# Patient Record
Sex: Female | Born: 1975 | Race: White | Hispanic: Yes | Marital: Married | Smoking: Former smoker
Health system: Southern US, Community
[De-identification: ages and names within clinical notes are randomized; demographics above are authoritative.]

## PROBLEM LIST (undated history)

## (undated) DIAGNOSIS — N946 Dysmenorrhea, unspecified: Secondary | ICD-10-CM

## (undated) DIAGNOSIS — N921 Excessive and frequent menstruation with irregular cycle: Secondary | ICD-10-CM

## (undated) DIAGNOSIS — D649 Anemia, unspecified: Secondary | ICD-10-CM

## (undated) DIAGNOSIS — Z8742 Personal history of other diseases of the female genital tract: Secondary | ICD-10-CM

## (undated) DIAGNOSIS — E559 Vitamin D deficiency, unspecified: Secondary | ICD-10-CM

## (undated) DIAGNOSIS — D269 Other benign neoplasm of uterus, unspecified: Secondary | ICD-10-CM

## (undated) HISTORY — PX: BREAST SURGERY: SHX581

## (undated) HISTORY — DX: Vitamin D deficiency, unspecified: E55.9

## (undated) HISTORY — PX: COSMETIC SURGERY: SHX468

## (undated) HISTORY — PX: AUGMENTATION MAMMAPLASTY: SUR837

## (undated) HISTORY — DX: Anemia, unspecified: D64.9

---

## 2006-10-20 HISTORY — PX: AUGMENTATION MAMMAPLASTY: SUR837

## 2013-08-15 ENCOUNTER — Ambulatory Visit (INDEPENDENT_AMBULATORY_CARE_PROVIDER_SITE_OTHER): Payer: BC Managed Care – PPO | Admitting: Gynecology

## 2013-08-15 ENCOUNTER — Other Ambulatory Visit (HOSPITAL_COMMUNITY)
Admission: RE | Admit: 2013-08-15 | Discharge: 2013-08-15 | Disposition: A | Discharge status: Home / Self Care | Payer: Medicare Other | Source: Ambulatory Visit | Attending: Gynecology | Admitting: Gynecology

## 2013-08-15 ENCOUNTER — Encounter: Payer: Self-pay | Admitting: Gynecology

## 2013-08-15 VITALS — BP 122/86 | Ht 65.5 in | Wt 144.0 lb

## 2013-08-15 DIAGNOSIS — D649 Anemia, unspecified: Secondary | ICD-10-CM

## 2013-08-15 DIAGNOSIS — Z01419 Encounter for gynecological examination (general) (routine) without abnormal findings: Secondary | ICD-10-CM

## 2013-08-15 DIAGNOSIS — N920 Excessive and frequent menstruation with regular cycle: Secondary | ICD-10-CM

## 2013-08-15 DIAGNOSIS — Z1151 Encounter for screening for human papillomavirus (HPV): Secondary | ICD-10-CM | POA: Insufficient documentation

## 2013-08-15 DIAGNOSIS — Z23 Encounter for immunization: Secondary | ICD-10-CM

## 2013-08-15 NOTE — Progress Notes (Signed)
Tina Garza Marval Regal 1976-01-14 [id number]   History:    38 y.o.  for annual gyn exam who is a new patient to the practice. Patient stated she has not had a gynecological exam since 2013 [facility]. She recently saw primary care physician here in [place] and was diagnosed with anemia and vitamin B12 deficiency. She's currently on a vitamin FE oral spray which she was informed to increase her iron absorption. She states her last hemoglobin was 10 g. She suffers from menorrhagia. She was recently diagnosed with vitamin D deficiency as well for which she is taking 50,000 units of vitamin D Q. Weekly for a 12 week course planned. The patient states that she's always had normal Pap smears the last one in 2012. He freely does her self breast examination. She stated that in addition to the CBC she had a normal cholesterol and blood sugar screening recently. Patient did state that several years ago in [place] she had a sonohysterogram but never got the report back.  Past medical history,surgical history, family history and social history were all reviewed and documented in the EPIC chart.  Gynecologic History Patient's last menstrual period was 08/05/2013. Contraception: condoms Last Pap: 2012. Results were: normal Last mammogram: not indicated. Results were: not indicated  Obstetric History OB History  Gravida Para Term Preterm AB SAB TAB Ectopic Multiple Living  1 1        1     # Outcome Date GA Lbr Len/2nd Weight Sex Delivery Anes PTL Lv  1 PAR                ROS: A ROS was performed and pertinent positives and negatives are included in the history.  GENERAL: No fevers or chills. HEENT: No change in vision, no earache, sore throat or sinus congestion. NECK: No pain or stiffness. CARDIOVASCULAR: No chest pain or pressure. No palpitations. PULMONARY: No shortness of breath, cough or wheeze. GASTROINTESTINAL: No abdominal pain, nausea, vomiting or diarrhea,  melena or bright red blood per rectum. GENITOURINARY: No urinary frequency, urgency, hesitancy or dysuria. MUSCULOSKELETAL: No joint or muscle pain, no back pain, no recent trauma. DERMATOLOGIC: No rash, no itching, no lesions. ENDOCRINE: No polyuria, polydipsia, no heat or cold intolerance. No recent change in weight. HEMATOLOGICAL: anemia as a result of her heavy periods lasting up to 12 days NEUROLOGIC: No headache, seizures, numbness, tingling or weakness. PSYCHIATRIC: No depression, no loss of interest in normal activity or change in sleep pattern.     Exam: chaperone present  BP 122/86  Ht 5' 5.5" (1.664 m)  Wt 144 lb (65.318 kg)  BMI 23.59 kg/m2  LMP 08/05/2013  Body mass index is 23.59 kg/(m^2).  General appearance : Well developed well nourished female. No acute distress HEENT: Neck supple, trachea midline, no carotid bruits, no thyroidmegaly Lungs: Clear to auscultation, no rhonchi or wheezes, or rib retractions  Heart: Regular rate and rhythm, no murmurs or gallops Breast:Examined in sitting and supine position were symmetrical in appearance, no palpable masses or tenderness,  no skin retraction, no nipple inversion, no nipple discharge, no skin discoloration, no axillary or supraclavicular lymphadenopathy Abdomen: no palpable masses or tenderness, no rebound or guarding Extremities: no edema or skin discoloration or tenderness  Pelvic:  Bartholin, Urethra, Skene Glands: Within normal limits             Vagina: No gross lesions or discharge  Cervix: No gross lesions or discharge  Uterus  anteverted, normal size,  shape and consistency, non-tender and mobile  Adnexa  Without masses or tenderness  Anus and perineum  normal   Rectovaginal  normal sphincter tone without palpated masses or tenderness             Hemoccult not indicated     Assessment/Plan:  37 y.o. 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 Patient stated that in the  past she has tried the NuvaRing but didn't like the effects and also many years ago she had been on the oral contraceptive pill as well as a contraceptive patch. We discussed obtain sonohysterogram report from a couple years ago her from [facility] and also we will try to obtain the results of her recent labs at her primary care physician's office. We will schedule her in the next couple weeks to have a sonohysterogram here in office. I have given her literature information on the Mirena IUD which will serve 2 purposes for her 1 for cycle control and improve her anemia as well as for contraception. Pap smear with HPV screen was done today. We will also test her for von Willebrand's disease. We discussed the importance of monthly self breast examinations. Literature information was provided.  Note: This dictation was prepared with  Dragon/digital dictation along withSmart phrase technology. Any transcriptional errors that result from this process are unintentional.   Ok Edwards MD, 5:05 PM 08/15/2013

## 2013-08-15 NOTE — Patient Instructions (Signed)
Informacin sobre el dispositivo intrauterino  (Intrauterine Device Information) El dispositivo intrauterino (DIU) se inserta en el tero e impide el embarazo. Hay dos tipos de DIU:  DIU de cobre. Este tipo de DIU est recubierto con un alambre de cobre y se inserta dentro del tero. El cobre hace que el tero y las trompas de Falopio produzcan un liquido que [employer] espermatozoides. El DIU de cobre puede Geneticist, molecular durante 10 aos. DIU hormonal. Este tipo de DIU contiene la hormona progestina (progesterona sinttica). La hormona espesa el moco cervical y evita que los espermatozoides ingresen al tero y tambin afina la membrana que cubre el tero para evitar la implantacin del vulo fertilizado. La hormona debilita o destruye los espermatozoides que ingresan al tero. El DIU hormonal puede Geneticist, molecular durante 5 aos. El mdico se asegurar de que usted es una buena candidata para usar el DIU cono anticonceptivo. Hable con su mdico acerca de los posibles efectos secundarios.  VENTAJAS Es muy eficaz, reversible, de accin prolongada y de bajo mantenimiento. No hay efectos secundarios relacionados con el estrgeno. El DIU puede ser utilizado durante la Market researcher. No est asociado con el aumento de [place]. Funciona inmediatamente despus de la insercin. El DIU de cobre no interfiere con las hormonas femeninas. El DIU con progesterona puede hacer que los perodos menstruales no sean tan abundantes. El DIU de progesterona puede usarse durante 5 aos. El DIU de cobre puede usarse durante 10 aos. DESVENTAJAS El DIU de progesterona puede estar asociado con patrones de sangrado irregular. El DIU de cobre puede hacer que el flujo menstrual ms abundante y doloroso. Puede experimentar clicos y sangrado vaginal despus de la insercin. Document Released: 03/26/2010 Document Revised: 12/29/2011 [facility] Patient Information 2014 [place], [place]. Vacuna antigripal (vacuna  antigripal inactivada) 2013 2014, Lo que debe saber  (Influenza Vaccine [Flu Vaccine, Inactivated] 2013 2014, What You Need to Know) PORQU VACUNARSE?   La influenza ("gripe") es una enfermedad contagiosa que se propaga por los [place] en invierno, por lo general entre octubre y [place].  La causa de la gripe es el virus de la influenza, y se puede contagiar por la tos, al estornudar y por el contacto cercano.  Cualquier persona puede Writer gripe, Biomedical engineer el riesgo es mayor entre los nios. Los sntomas aparecen rpidamente y pueden durar [address]. Pueden ser:  Grant Ruts o escalofros.  Dolor de Advertising copywriter.  Dolores musculares.  La fatiga.  Tos.  Dolor de [place].  Secrecin o congestin nasal. La gripe puede hacer que algunas personas se enfermen ms que otros. Entre [employer] se incluyen a los nios pequeos, las [employer] International de 65 aos, las mujeres embarazadas y las personas con Runner, broadcasting/film/video, como enfermedades cardacas, pulmonares o renales, o que tienen un sistema inmunolgico debilitado. La vacuna contra la gripe es especialmente importante para estas personas y para todos los que estn en estrecho contacto con ellos.  La gripe tambin puede causar neumona y Theme park manager las afecciones existentes. En los nios, puede provocar diarrea y convulsiones.  Cada ao miles de [employer] [place] debido a la gripe y muchos ms deben ser hospitalizados.  La vacuna contra la gripe es la mejor proteccin que existe contra la gripe y sus complicaciones. La vacuna contra la gripe tambin ayuda a prevenir la propagacin de la gripe de Neomia Dear persona a Educational psychologist.  VACUNA INACTIVADA CONTRA LA GRIPE  Hay dos tipos de vacunas contra la gripe:   Usted recibir  la vacuna de la gripe inactivada, que no contiene virus vivo. Se administra en forma de inyeccin con Marella Bile y se llama la "vacuna antigripal".  Otro tipo de vacuna con virus vivos, atenuados (debilitados), se  aplica en forma de aerosol en las fosas nasales. Esta vacuna se describe en el apartado Informacin sobre las vacunas. Se recomienda aplicarse la vacuna contra la gripe todos los [place]. Los nios [employer] 6 meses y los 8 aos de edad deben recibir 2 dosis Dispensing optician que se vacunen.  Los virus de la gripe [place] constantemente. Cada ao, la vacuna contra la gripe se actualiza para proteger contra los virus que tienen ms probabilidades de causar la enfermedad ese ao. Aunque la vacuna no puede prevenir todos los casos de gripe, es nuestra mejor defensa contra la enfermedad. Vacuna contra la gripe inactivada protege contra 3 o 4 virus diferentes.  Se tarda aproximadamente 2 semanas para desarrollar la proteccin despus de la vacunacin y la proteccin dura entre algunos meses y un ao.  Muchas veces se confunden con la gripe algunas enfermedades que no son causadas por el virus de la gripe. La vacuna contra la gripe no previene estas enfermedades. Slo se puede prevenir la gripe.  Para las personas de ms de 65 aos, se dispone de una vacuna contra la gripe de "dosis elevada". La persona que aplica la vacuna puede darle ms informacin al respecto.  Algunas de las vacunas contra la gripe inactivada contienen una cantidad muy pequea de un conservante a base de mercurio llamado timerosal. Algunos estudios han demostrado que el timerosal en las vacunas no es perjudicial, pero se dispone de vacunas contra la gripe que no contienen el conservante.  ALGUNAS PERSONAS NO DEBEN RECIBIR ESTA VACUNA Informe a la persona que le aplica la vacuna:   Si sufre alguna alergia grave (que pone en peligro la vida). Si alguna vez tuvo una reaccin alrgica potencialmente mortal despus de Neomia Dear dosis de la vacuna contra la gripe, o tuvo una alergia grave a cualquiera de los componentes de [place], es posible que se le recomiende no recibir una dosis. La [employer]son de las vacunas contra la gripe, aunque no todas, contienen  una pequea cantidad de [place].  Si alguna vez ha sufrido el sndrome de Pension scheme manager (una enfermedad paralizante grave tambin llamada GBS). Algunas personas con antecedentes de GBS no deben recibir esta vacuna. Debe comentarlo con su mdico.  Si no se siente bien. Podran sugerirle que espere hasta sentirse mejor. Pero debe volver. RIESGOS DE UNA REACCIN A LA VACUNA Con la vacuna, como cualquier medicamento, existe la posibilidad de sufrir efectos secundarios. Suelen ser leves y desaparecen por s solos.  Los efectos secundarios graves son [place], pero son Lynnae Sandhoff raros. Vacuna de la gripe inactivada no contiene el virus vivo de la gripe, la gripe por lo tanto enfermarse por recibir la vacuna no es posible.  Episodios de desmayo leves y sntomas relacionados (tales como sacudidas) pueden presentarse despus de cualquier procedimiento mdico, incluyendo la vacunacin. Si permanece sentado o recostado durante 15 minutos despus de la vacunacin puede ayudar a [employer] Corporation y las lesiones causadas por las cadas. Informe al mdico si se siente mareado o aturdido, tiene [employer] Industries visin o zumbidos en los odos.  Problemas leves luego de recibir la vacuna de la gripe inactivada:   Barista, enrojecimiento o Paramedic en el que le aplicaron la vacuna.  Ronquera; dolor, inflamacin o picazn en  los ojos o tos.  Grant Ruts.  Dolores.  Dolor de [place].  Picazn.  Fatiga. Si estos problemas ocurren, en general comienzan poco despus de vacunarse y duran 1  2 das.  Problemas moderados luego de recibir la vacuna de la gripe inactivada:   Los nios que reciben la vacuna contra la gripe inactivada y Research scientist (medical) antineumoccica (PCV13) al mismo tiempo, pueden tener un mayor riesgo de sufrir convulsiones causadas por fiebre. Consulte a su mdico para obtener ms informacin. Informe a su mdico si un nio que est recibiendo la vacuna contra la gripe ha tenido una  convulsin. Problemas graves luego de recibir la vacuna inactivada contra la gripe:   Neomia Dear reaccin alrgica grave puede ocurrir despus de la administracin de cualquier vacuna (se estima en menos de 1 en un milln de dosis).  Hay una pequea posibilidad de que la vacuna de la gripe inactivada est asociada con el sndrome de Guillain-Barr (GBS), no ms de 1 o 2 casos por milln de personas vacunadas. Es Chief Operating Officer que el riesgo de sufrir complicaciones graves por la gripe, que puede prevenirse con la vacunacin. Se controla permanentemente la seguridad de las vacunas. Para obtener ms informacin, consulte [email or web address]/  QU PASA SI HAY UNA REACCIN GRAVE?  Qu signos debo buscar?   Observe todo lo que le preocupe, como signos de una reaccin alrgica grave, fiebre muy alta o cambios en el comportamiento. Los signos de Runner, broadcasting/film/video grave pueden incluir urticaria, hinchazn de la cara y la garganta, dificultad para respirar, ritmo cardaco acelerado, mareos y debilidad. Pueden comenzar entre unos pocos minutos y algunas horas despus de la vacunacin.  Qu debo hacer?   Si usted piensa que se trata de una reaccin alrgica grave o de otra emergencia que no puede esperar, llame al 911 o lleve a la persona al hospital ms cercano. De lo contrario, llame a su mdico.  Despus, la reaccin debe informarse a la "Vaccine Adverse Event Reporting System" (Sistema de informacin sobre efectos adversos de las vacunas -VAERS). Su mdico puede presentar este informe, o puede hacerlo usted mismo a travs del sitio web de VAERS, en www.vaers.LAgents.no, o llamando al (916) 030-7362. VAERS es slo para informar reacciones. No brindan consejo mdico.  PROGRAMA NACIONAL DE COMPENSACIN DE DAOS POR VACUNAS  El National Vaccine Injury Compensation Program (VICP) es un programa federal que fue creado para compensar a las personas que puedan haber sufrido daos al recibir ciertas vacunas.   Aquellas personas que consideren que han sufrido un dao como consecuencia de una vacuna y quieren saber ms acerca del programa y como presentar Roslynn Amble, [place] llamar al [phone] o visitar su sitio web en SpiritualWord.at.  CMO PUEDO OBTENER MS INFORMACIN?   Consulte a su mdico.  Comunquese con el servicio de salud de su localidad o [address].  Comunquese con los Centros para el control y la prevencin de Child psychotherapist for Disease Control and Prevention , CDC).  Llame al 930-664-1498 (1-800-CDC-INFO) o  Visite la pgina web de los CDC en BiotechRoom.com.cy. CDC Inactivated Influenza Vaccine Interim VIS (05/14/12)  Document Released: 01/02/2009 Document Revised: 06/30/2012 ExitCare Patient Information 2014 [place], [place].

## 2013-08-17 ENCOUNTER — Other Ambulatory Visit: Payer: Self-pay | Admitting: Gynecology

## 2013-08-17 DIAGNOSIS — N920 Excessive and frequent menstruation with regular cycle: Secondary | ICD-10-CM

## 2013-08-19 LAB — VON WILLEBRAND PANEL: Ristocetin Co-factor, Plasma: 86 % (ref 42–200)

## 2013-09-02 ENCOUNTER — Ambulatory Visit (INDEPENDENT_AMBULATORY_CARE_PROVIDER_SITE_OTHER): Payer: BC Managed Care – PPO | Admitting: Gynecology

## 2013-09-02 ENCOUNTER — Other Ambulatory Visit: Payer: Self-pay | Admitting: Gynecology

## 2013-09-02 ENCOUNTER — Telehealth: Payer: Self-pay | Admitting: Gynecology

## 2013-09-02 ENCOUNTER — Ambulatory Visit (INDEPENDENT_AMBULATORY_CARE_PROVIDER_SITE_OTHER): Payer: BC Managed Care – PPO

## 2013-09-02 DIAGNOSIS — N83209 Unspecified ovarian cyst, unspecified side: Secondary | ICD-10-CM

## 2013-09-02 DIAGNOSIS — D649 Anemia, unspecified: Secondary | ICD-10-CM

## 2013-09-02 DIAGNOSIS — N83202 Unspecified ovarian cyst, left side: Secondary | ICD-10-CM | POA: Insufficient documentation

## 2013-09-02 DIAGNOSIS — N920 Excessive and frequent menstruation with regular cycle: Secondary | ICD-10-CM

## 2013-09-02 DIAGNOSIS — Z3049 Encounter for surveillance of other contraceptives: Secondary | ICD-10-CM

## 2013-09-02 MED ORDER — MEGESTROL ACETATE 40 MG PO TABS: ORAL_TABLET | ORAL | Status: DC

## 2013-09-02 MED ORDER — POLYSACCH FE COMPLEX-VIT C-FA 100-60-1 MG/5ML PO SOLR: 100.0000 mg | Freq: Every day | ORAL | Status: DC

## 2013-09-02 MED ORDER — LEVONORGESTREL 20 MCG/24HR IU IUD: INTRAUTERINE_SYSTEM | Freq: Once | INTRAUTERINE | Status: DC

## 2013-09-02 NOTE — Telephone Encounter (Signed)
09/02/13-PT ADVISED MIRENA COVERED AT 100%,NO COPAY. APPT JF FOR 09/05/13/[facility]

## 2013-09-02 NOTE — Progress Notes (Signed)
Patient presented to the office today for her ongoing evaluation of her menorrhagia and anemia. She was seen as a new patient for the first time last month. Her PCP recently diagnosed her with vitamin B12 deficiency and anemia.She's currently on a vitamin FE oral spray which she was informed to increase her iron absorption. She states her last hemoglobin was 10 g. She was recently diagnosed with vitamin D deficiency as well for which she is taking 50,000 units of vitamin D Q. Weekly for a 12 week course planned. The patient states that she's always had normal Pap smears the last one in 2012. Patient stated that in the past she has tried the NuvaRing but didn't like the effects and also many years ago she had been on the oral contraceptive pill as well as a contraceptive patch. We discussed obtain sonohysterogram report from a couple years ago her from [facility] and also we will try to obtain the results of her recent labs at her primary care physician's office.I have given her literature information on the Mirena IUD which will serve 2 purposes for her 1 for cycle control and improve her anemia as well as for contraception. Pap smear with HPV screen on recent visit was normal.  Patient stated that her last menstrual period started approximately 8 days ago. Ultrasound today as follows:  Uterus measures 9.0 x 6.2 x 4.9 cm with endometrial stripe of 6.7 mm. 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 Bladder was normal. No fluid in the cul-de-sac. A sterile catheter was introduced into the uterine cavity and normal saline was instilled. There were no intracavitary defects noted.  Assessment/plan: Patient with menorrhagia and anemia. Von Willebrand panel done here in the office last visit was normal. Pap smear was normal. Patient will be seeing her PCP on Monday and will have her CBC check as well as vitamin B12 level. She will return back on Monday to  place a Mirena IUD for contraception as well as for cycle control. To stop her bleeding she is going to be prescribed today Megace 40 mg 1 by mouth twice a day for 7 days. She is going to stop her current iron regimen and she is going to be started on NovaFerrum 1 tablespoon (5 mL) daily because she has trouble with oral iron tablets.

## 2013-09-05 ENCOUNTER — Encounter: Payer: Self-pay | Admitting: Gynecology

## 2013-09-05 ENCOUNTER — Ambulatory Visit (INDEPENDENT_AMBULATORY_CARE_PROVIDER_SITE_OTHER): Payer: BC Managed Care – PPO | Admitting: Gynecology

## 2013-09-05 ENCOUNTER — Ambulatory Visit: Payer: BC Managed Care – PPO | Admitting: Gynecology

## 2013-09-05 ENCOUNTER — Encounter: Payer: BC Managed Care – PPO | Admitting: Gynecology

## 2013-09-05 VITALS — BP 126/78

## 2013-09-05 DIAGNOSIS — Z975 Presence of (intrauterine) contraceptive device: Secondary | ICD-10-CM | POA: Insufficient documentation

## 2013-09-05 DIAGNOSIS — Z30430 Encounter for insertion of intrauterine contraceptive device: Secondary | ICD-10-CM

## 2013-09-05 NOTE — Progress Notes (Signed)
The patient was sedate have the Mirena IUD placed. Please see previous notes from last encounter 09/02/2013. Patient suffered from dysmenorrhea and menorrhagia and iron deficiency anemia. She brought her CBC from her PCP office from 07/15/2013 which had indicated her hemoglobin was 10.2 and hematocrit 32.7 platelet count 509,000. Patient is on iron supplementation recently. The IUD was not only for contraception but are cut down her bleeding to correct her anemia.                                                                    IUD procedure note       Patient presented to the office today for placement of Mirena IUD. The patient had previously been provided with literature information on this method of contraception. The risks benefits and pros and cons were discussed and all her questions were answered. She is fully aware that this form of contraception is 99% effective and is good for 5 years.  Pelvic exam: Bartholin urethra Skene glands: Within normal limits Vagina: No lesions or discharge Cervix: No lesions or discharge Uterus: anteverted position Adnexa: No masses or tenderness Rectal exam: Not done  The cervix was cleansed with Betadine solution. A single-tooth tenaculum was placed on the anterior cervical lip. The uterus sounded to 7-1/2 centimeter. The IUD was shown to the patient and inserted in a sterile fashion. The IUD string was trimmed. The single-tooth tenaculum was removed. Patient was instructed to return back to the office in one month for follow up.

## 2013-09-05 NOTE — Patient Instructions (Signed)
Informacin sobre el dispositivo intrauterino (Intrauterine Device Information) Un dispositivo intrauterino (DIU) se inserta en el tero e impide el embarazo. Hay dos tipos de DIU:   DIU de cobre: este tipo de DIU est recubierto con un alambre de cobre y se inserta dentro del tero. El cobre hace que el tero y las trompas de Falopio produzcan un liquido que destruye los espermatozoides. El DIU de cobre puede permanecer en el lugar durante 10 aos.  DIU con hormona: este tipo de DIU contiene la hormona progestina (progesterona sinttica). Las hormonas hacen que el moco cervical se haga ms espeso, lo que evita que el esperma ingrese al tero. Tambin hace que la membrana que recubre internamente al tero sea ms delgada lo que impide el implante del vulo fertilizado. La hormona debilita o destruye los espermatozoides que ingresan al tero. Alguno de los tipos de DIU hormonal pueden permanecer en el lugar durante 5 aos y otros tipos pueden dejarse en el lugar por 3 aos. El mdico se asegurar de que usted sea una buena candidata para usar el DIU. Converse con su mdico acerca de los posibles efectos secundarios.  VENTAJAS DEL DISPOSITIVO INTRAUTERINO  El DIU es muy eficaz, reversible, de accin prolongada y de bajo mantenimiento.  No hay efectos secundarios relacionados con el estrgeno.  El DIU puede ser utilizado durante la lactancia.  No est asociado con el aumento de peso.  Funciona inmediatamente despus de la insercin.  El DIU hormonal funciona inmediatamente si se inserta dentro de los 7 das del inicio del perodo. Ser necesario que utilice un mtodo anticonceptivo adicional durante 7 das si el DIU hormonal se inserta en algn otro momento del ciclo.  El DIU de cobre no interfiere con las hormonas femeninas.  El DIU hormonal puede hacer que los perodos menstruales abundantes se hagan ms ligeros y que haya menos clicos.  El DIU hormonal puede usarse durante 3 a 5  aos.  El DIU de cobre puede usarse durante 10 aos. DESVENTAJAS DEL DISPOSITIVO INTRAUTERINO  El DIU hormonal puede estar asociado con patrones de sangrado irregular.  El DIU de cobre puede hacer que el flujo menstrual ms abundante y doloroso.  Puede experimentar clicos y sangrado vaginal despus de la insercin. Document Released: 03/26/2010 Document Revised: 06/08/2013 ExitCare Patient Information 2014 [employer].  

## 2013-09-06 ENCOUNTER — Other Ambulatory Visit: Payer: Self-pay | Admitting: Gynecology

## 2013-09-06 ENCOUNTER — Encounter: Payer: Self-pay | Admitting: Gynecology

## 2013-09-06 ENCOUNTER — Telehealth: Payer: Self-pay | Admitting: *Deleted

## 2013-09-06 ENCOUNTER — Telehealth: Payer: Self-pay

## 2013-09-06 NOTE — Telephone Encounter (Signed)
Prior authorization form completed for novaferrum solution and faxed back to [employer],will wait for response.

## 2013-09-06 NOTE — Telephone Encounter (Signed)
Patient needs liquid iron and Dr. Glenetta Hew asked me to check on it with pharmacy. Per pharmacist liquid iron is over the counter. It is liquid ferrous sulfate 220mg  per 5 ml. He said standard dose with a tablet is usually about 25 mg. So patient would probably need to take like a teaspoon and a half daily but that depends on how much you want her to take. He said he will go ahead and order it and she can come by tomorrow and get it but it is OTC.  Dr. Glenetta Hew asked me to notify patient of this and have her take 1 1/2 tsp daily.  I contacted patient and relayed this info and she will go to [facility] tomorrow to pick it up.

## 2013-09-06 NOTE — Progress Notes (Signed)
Patient rescheduled for later that day

## 2013-09-07 NOTE — Telephone Encounter (Signed)
This was done by Marthenia Rolling. Not longer needed.    Per pharmacist liquid iron is over the counter. It is liquid ferrous sulfate 220mg  per 5 ml. He said standard dose with a tablet is usually about 25 mg. So patient would probably need to take like a teaspoon and a half daily but that depends on how much you want her to take. He said he will go ahead and order it and she can come by tomorrow and get it but it is OTC.  Dr. Glenetta Hew asked me to notify patient of this and have her take 1 1/2 tsp daily. I contacted patient and relayed this info and [facility] will [facility] to [facility]

## 2013-09-12 ENCOUNTER — Other Ambulatory Visit: Payer: BC Managed Care – PPO

## 2013-09-12 ENCOUNTER — Ambulatory Visit: Payer: BC Managed Care – PPO | Admitting: Gynecology

## 2013-10-04 ENCOUNTER — Encounter: Payer: Self-pay | Admitting: Gynecology

## 2013-10-04 ENCOUNTER — Ambulatory Visit (INDEPENDENT_AMBULATORY_CARE_PROVIDER_SITE_OTHER): Payer: BC Managed Care – PPO | Admitting: Gynecology

## 2013-10-04 VITALS — BP 122/72

## 2013-10-04 DIAGNOSIS — N83209 Unspecified ovarian cyst, unspecified side: Secondary | ICD-10-CM

## 2013-10-04 DIAGNOSIS — N920 Excessive and frequent menstruation with regular cycle: Secondary | ICD-10-CM

## 2013-10-04 DIAGNOSIS — Z30431 Encounter for routine checking of intrauterine contraceptive device: Secondary | ICD-10-CM

## 2013-10-04 DIAGNOSIS — N83202 Unspecified ovarian cyst, left side: Secondary | ICD-10-CM

## 2013-10-04 DIAGNOSIS — D649 Anemia, unspecified: Secondary | ICD-10-CM

## 2013-10-04 NOTE — Progress Notes (Signed)
Patient presented to the office today for one month followup after having placed a [facility] IUD for her menorrhagia and anemia. Her PCP recently diagnosed her with vitamin B12 deficiency and anemia. She states her last hemoglobin was 10 g. She was recently diagnosed with vitamin D deficiency as well for which she is taking 50,000 units of vitamin D Q. Weekly for a 12 week course planned. The patient states that she's always had normal Pap smears the last one in 2012. Patient stated that in the past she has tried the NuvaRing but didn't like the effects and also many years ago she had been on the oral contraceptive pill as well as a contraceptive patch. Patient had a Von Willebrand panel recently which was normal. She is currently taking NovaFerrum 1 tablespoon (5 mL) daily because she has trouble with oral iron tablets.  Patient states that her bleeding has decreased although she started her cycle the beginning of this month and has been bleeding on and off.  Exam: Bartholin urethra Skene was within normal limits Vagina: Menstrual blood was present Cervix: IUD string seen Uterus: Anteverted normal size shape and consistency Bimanual exam: Right adnexa no palpable mass or tenderness Left adnexa questionable small ovarian cyst  present Rectal exam not done  Assessment/plan: Patient with past history of menorrhagia unresponsive to oral contraceptive pills or NuvaRing. We placed a Jearld Adjutant IUD last month in an effort to control her bleeding while she takes her iron supplementation to correct her anemia. We will check a CBC today to compare with previous study 3 months ago whereby her hemoglobin was 10 g. I'm going to start her on Estrace 1 mg to take 1 by mouth daily for 30 days to help with her bleeding today. She will return back in February for follow up ultrasound on her left ovarian cyst. She has voiced to me today that if her bleeding continues she would like to proceed with hysterectomy since she and  her husband are no longer interested in having any more children. Of note recent Pap smear had also been normal.

## 2013-10-05 LAB — CBC WITH DIFFERENTIAL/PLATELET
Basophils Absolute: 0 10*3/uL (ref 0.0–0.1)
Basophils Relative: 0 % (ref 0–1)
Eosinophils Absolute: 0 10*3/uL (ref 0.0–0.7)
Hemoglobin: 9.6 g/dL — ABNORMAL LOW (ref 12.0–15.0)
Lymphs Abs: 1.6 10*3/uL (ref 0.7–4.0)
MCH: 23.2 pg — ABNORMAL LOW (ref 26.0–34.0)
MCHC: 31 g/dL (ref 30.0–36.0)
Monocytes Relative: 8 % (ref 3–12)
Neutro Abs: 3.1 10*3/uL (ref 1.7–7.7)
Neutrophils Relative %: 60 % (ref 43–77)
Platelets: 404 10*3/uL — ABNORMAL HIGH (ref 150–400)
RDW: 16.8 % — ABNORMAL HIGH (ref 11.5–15.5)

## 2013-10-06 ENCOUNTER — Telehealth: Payer: Self-pay

## 2013-10-06 ENCOUNTER — Other Ambulatory Visit: Payer: Self-pay | Admitting: Gynecology

## 2013-10-06 DIAGNOSIS — D649 Anemia, unspecified: Secondary | ICD-10-CM

## 2013-10-06 MED ORDER — ESTRADIOL 1 MG PO TABS: 1.0000 mg | ORAL_TABLET | Freq: Every day | ORAL | Status: DC

## 2013-10-06 NOTE — Telephone Encounter (Signed)
I thought I had put in the prescription electronically. Please call a prescription of Estrace 1 mg one by mouth daily for 30 days only no refills

## 2013-10-06 NOTE — Telephone Encounter (Signed)
Patient said at her office visit Tuesday you told her you were sending Rx to pharmacy but she has been there twice and it is not there.  I reviewed note thinking I would call it in if in note. Just wanted to double check because your note says "Estrace" I wanted to be sure that was correct.

## 2013-10-06 NOTE — Telephone Encounter (Signed)
Rx sent. Patient informed. 

## 2013-10-06 NOTE — Telephone Encounter (Signed)
Message copied by Keenan Bachelor on Thu Oct 06, 2013 11:38 AM ------      Message from: Ok Edwards      Created: Wed Oct 05, 2013  5:25 PM       Please inform patient that I would like for her to take her liquid iron twice a day and I would like to repeat her CBC in 3 months. She just recently had a Mirena IUD placed for menstrual cycle control. ------

## 2013-11-02 ENCOUNTER — Other Ambulatory Visit: Payer: Self-pay | Admitting: Gynecology

## 2013-11-02 NOTE — Telephone Encounter (Signed)
That is correct no refill. One one daily for thirty days only.

## 2013-11-02 NOTE — Telephone Encounter (Signed)
At her 10/04/13 office visit you wrote "I'm going to start her on Estrace 1 mg to take 1 by mouth daily for 30 days to help with her bleeding today."  You indicated no refill on the Rx.

## 2013-11-11 ENCOUNTER — Telehealth: Payer: Self-pay | Admitting: *Deleted

## 2013-11-11 MED ORDER — MEGESTROL ACETATE 40 MG PO TABS: 40.0000 mg | ORAL_TABLET | Freq: Two times a day (BID) | ORAL | Status: DC

## 2013-11-11 MED ORDER — DOXYCYCLINE HYCLATE 100 MG PO CAPS
100.0000 mg | ORAL_CAPSULE | Freq: Two times a day (BID) | ORAL | Status: DC
Start: 2013-11-11 — End: 2014-11-07

## 2013-11-11 NOTE — Telephone Encounter (Signed)
Pt called c/o bleeding x 22 days now, not heavy, has IUD, no other problems. Pt said last Nov. You gave her megace to help stop bleeding as well. Please advise

## 2013-11-11 NOTE — Telephone Encounter (Signed)
Please have patient do a home pregnancy test. Then she can take Megace 40 mg twice a day for 10 days. Make appointment for ultrasound to make sure that the IUD still in the right place and it has not moved. Call in a prescription for Vibramycin 100 mg one by mouth twice a day for 7 days in the event of an inflammation of the uterine lining causing her bleeding.

## 2013-11-11 NOTE — Telephone Encounter (Signed)
Pt informed with the below note, rx sent, pt has ultrasound scheduled on 11/28/13

## 2013-11-28 ENCOUNTER — Ambulatory Visit (INDEPENDENT_AMBULATORY_CARE_PROVIDER_SITE_OTHER): Payer: BC Managed Care – PPO | Admitting: Gynecology

## 2013-11-28 ENCOUNTER — Other Ambulatory Visit: Payer: Self-pay | Admitting: Gynecology

## 2013-11-28 ENCOUNTER — Ambulatory Visit (INDEPENDENT_AMBULATORY_CARE_PROVIDER_SITE_OTHER): Payer: BC Managed Care – PPO

## 2013-11-28 ENCOUNTER — Encounter: Payer: Self-pay | Admitting: Gynecology

## 2013-11-28 VITALS — BP 130/84

## 2013-11-28 DIAGNOSIS — N83202 Unspecified ovarian cyst, left side: Secondary | ICD-10-CM

## 2013-11-28 DIAGNOSIS — N920 Excessive and frequent menstruation with regular cycle: Secondary | ICD-10-CM

## 2013-11-28 DIAGNOSIS — T8389XA Other specified complication of genitourinary prosthetic devices, implants and grafts, initial encounter: Secondary | ICD-10-CM

## 2013-11-28 DIAGNOSIS — N83209 Unspecified ovarian cyst, unspecified side: Secondary | ICD-10-CM

## 2013-11-28 DIAGNOSIS — N949 Unspecified condition associated with female genital organs and menstrual cycle: Secondary | ICD-10-CM

## 2013-11-28 DIAGNOSIS — N925 Other specified irregular menstruation: Secondary | ICD-10-CM

## 2013-11-28 DIAGNOSIS — Z30431 Encounter for routine checking of intrauterine contraceptive device: Secondary | ICD-10-CM

## 2013-11-28 DIAGNOSIS — N938 Other specified abnormal uterine and vaginal bleeding: Secondary | ICD-10-CM

## 2013-11-28 MED ORDER — MEGESTROL ACETATE 40 MG PO TABS: 40.0000 mg | ORAL_TABLET | Freq: Two times a day (BID) | ORAL | Status: DC

## 2013-11-28 NOTE — Patient Instructions (Signed)
Endometriosis (Endometriosis) La endometriosis es una enfermedad en la que el tejido que rodea al tero (endometrio) crece fuera de su ubicacin normal. El tejido puede crecer en muchos lugares cerca del tero, pero comnmente crece en los ovarios, las trompas de [place], la vagina o el intestino. Dado que el tero expulsa o desprende su revestimiento en cada ciclo menstrual, hay sangrado en el lugar donde se localiza el tejido endometrial. Esto puede causar dolor porque la sangre es irritante para los tejidos que no estn normalmente expuestos a Acupuncturist.  CAUSAS  Se desconoce la causa de la endometriosis.  SIGNOS Y SNTOMAS  A menudo, no hay sntomas. Cuando se presentan sntomas, estos pueden variar segn la ubicacin del tejido desplazado. Pueden ocurrir diversos sntomas en diferentes momentos. Aunque los sntomas se producen principalmente durante el perodo menstrual de [place], tambin pueden aparecer en la mitad del ciclo y generalmente terminan con la menopausia. Algunas personas pueden pasar meses sin experimentar ningn tipo de sntomas. Los sntomas pueden ser:  Dolor abdominal o en la espalda. Sangrado ms abundante durante los perodos [employer]. Dolor durante las [employer]. Dolor al defecar. Infertilidad. DIAGNSTICO  El mdico le preguntar acerca de sus sntomas y le har un examen fsico. Se pueden realizar varios estudios, por ejemplo:  Anlisis de [place] y [place]. Estos se realizan para ayudar a Armed forces logistics/support/administrative officer. Ecografas. Este estudio se realiza para observar el tejido anormal. Ecografa de la parte inferior del intestino (enema de bario). Laparoscopia. En este procedimiento, se inserta un tubo delgado, que emite luz y tiene una pequea cmara en el extremo (laparoscopio) dentro del abdomen. Esto ayuda a que su mdico observe el tejido anormal para confirmar el diagnstico. El mdico tambin puede quitar una pequea parte de tejido anormal (biopsia) que  encuentra. Posteriormente esta muestra de tejido se enva a un laboratorio para examinarlo con un microscopio. TRATAMIENTO  El tratamiento variar y puede incluir lo siguiente:  Medicamentos para Engineer, materials. Los antiinflamatorios no esteroides Murriel Hopper) son un tipo de analgsico que pueden ayudar a Engineer, materials causado por la endometriosis. Terapia hormonal. Cuando se use la terapia hormonal, se eliminan los perodos menstruales. Esto elimina la exposicin mensual a la sangre del tejido endometrial desplazado. Ciruga. Algunas veces puede hacerse una ciruga para extirpar el tejido endometrial anormal. En casos graves, la ciruga puede hacerse para extirpar las trompas de [place], el tero y los ovarios (histerectoma). INSTRUCCIONES PARA EL CUIDADO EN EL HOGAR  Utilice los medicamentos de venta libre o recetados para Primary school teacher, el malestar o la [place], segn se lo indique el mdico. No tome aspirina porque puede aumentar el sangrado cuando no recibe terapia hormonal. Evite actividades que produzcan dolor, incluida la actividad sexual. SOLICITE ATENCIN MDICA SI: Tiene dolor plvico durante los perodos menstruales y antes y despus de [place]. Siente dolor plvico [employer] perodos menstruales que empeora durante el perodo. Experimenta dolor plvico durante la actividad sexual o despus de [place]. Siente dolor plvico al defecar u orinar, especialmente durante el perodo menstrual. Tiene dificultad para quedar embarazada. SOLICITE ATENCIN MDICA DE INMEDIATO SI:  El dolor es intenso y no responde a los analgsicos. Siente nuseas y vmitos intensos, o no puede [employer]. Tiene dolor que se limita a la parte inferior derecha del abdomen. Presenta hinchazn o aumento del dolor en el abdomen. Observa sangre en la materia fecal. Tiene fiebre o sntomas persistentes durante ms de 2a 3das. Tiene fiebre y los sntomas empeoran repentinamente. ASEGRESE DE  QUE:    Comprende estas instrucciones. Controlar su afeccin. Recibir ayuda de inmediato si no mejora o si empeora. Document Released: 10/06/2005 Document Revised: 07/27/2013 [facility] Patient Information 2014 [place], [place]. Laparoscopa diagnstica (Diagnostic Laparoscopy) La laparoscopa es un procedimiento quirrgico relativamente simple, de uso habitual y breve (menos de una hora) que se lleva a cabo para diagnosticar y tratar enfermedades del abdomen. El laparoscopio (tubo delgado, que emite luz, del tamao de un lpiz y similar a un telescopio) se inserta en el abdomen a travs de una pequea incisin (corte realizado por un cirujano). A travs de este instrumento, el profesional podr observar Ford Motor Companydirectamente los rganos del interior del abdomen (vientre) y ver si hay algo anormal. La laparoscopa podr llevarse a cabo tanto en el hospital como en un consultorio. Podrn administrarle un sedante suave que lo ayudar a relajarse antes y durante el procedimiento. Una vez en la sala de operaciones, le administrarn una anestesia general (a menos que usted y el profesional elijan otro tipo de anestesia). Despus de la laparoscopa, que generalmente dura menos de Georgianne Fickuna hora, ser Emerson Electricmonitoreado en una sala de recuperacin durante algunas horas. Cuando regrese a Pensions consultantsu casa, la Arts administratorrecuperacin completa le llevar Progress Energyentre dos y [place] Beachtres das. RIESGOS Y COMPLICACIONES Comparados con los beneficios, los riesgos de la laparoscopia son relativamente pocos. El Animal nutritionistprofesional comentar con usted los riesgos antes del procedimiento. Algunos problemas que pueden ocurrir luego de la intervencin son:  Infecciones.  Hemorragias.  Puede ocurrir que se lesionen otros rganos.  Efectos secundarios de Higher education careers adviserla anestesia. PROCEDIMIENTO Una vez que se encuentra anestesiado, el cirujano insufla el abdomen con un gas inofensivo (dixido de carbono) para Research officer, political partyfacilitar la observacin de los rganos de la pelvis. El cirujano introduce el laparoscopio a  travs de una pequea incisin en el ombligo o alrededor del mismo. Podr insertar otros instrumentos, como una sonda para mover los rganos o Education officer, environmentalrealizar algn procedimiento a travs de otra pequea incisin.  En ocasiones se toma una biopsia (muestra de tejido) para un diagnstico ms preciso o para Consulting civil engineerconfirmar una enfermedad. La biopsia consiste en tomar una pequea muestra de tejido durante la laparoscopia para enviarlo al patlogo (especialista en la observacin de clulas y muestras de tejido) y que lo examine en el microscopio para un diagnstico a nivel de los tejidos. DESPUES DEL PROCEDIMIENTO  Se libera el gas del abdomen.  Las incisiones se cierran con puntos (suturas). Debido a que las incisiones son pequeas (generalmente de menos de 1 cm) las molestias son mnimas luego del procedimiento. Es posible que sienta cierto Sales executivemalestar en la garganta. Es consecuencia de Education officer, communityla colocacin del tubo mientras se encontraba dormido. Es posible que sienta algn dolor abdominal no muy intenso. Tambin podr sentir Federal-Mogulmolestias en las incisiones realizadas para insertar los instrumentos en el abdomen.  El tiempo de recuperacin es reducido, siempre que no haya habido complicaciones.  Har reposo en la sala de recuperacin hasta que se encuentre estable y se sienta bien. Si no aparecen complicaciones, podr regresar a su casa. AVERIGE LOS RESULTADOS DE SU ANLISIS Durante su visita no contar con todos los Sun Microsystemsresultados de los anlisis. En este caso, tenga otra entrevista con su mdico para conocerlos. No piense que el resultado es normal si no tiene noticias de su mdico o de la institucin mdica. Es Copyimportante el seguimiento de todos los Nickersonresultados de [place] IIlos anlisis. INSTRUCCIONES PARA EL CUIDADO DOMICILIARIO  Tome todos los medicamentos tal como se le indic.  Utilice los medicamentos de venta libre o de prescripcin  para Chief Technology Officer, el malestar o la fiebre, segn se lo indique el profesional que lo asiste.  Reanude  las actividades habituales cuando se le indique.  Es preferible que se duche y no tome baos de inmersin.  Reanude la actividad sexual luego de [place], o cuando lo autoricen.  No conduzca mientras se encuentre bajo los efectos de narcticos. SOLICITE ATENCIN MDICA SI:  Siente un dolor abdominal inexplicable.  Siente dolor en los hombros, en la regin de los tirantes.  Si se siente aturdido o siente que se va a Artist.  Siente escalofros.  Usted o su nio tienen una temperatura oral de ms de 38,9 C (102 F).  Observa un drenaje purulento (similar al pus) que proviene de alguna de las heridas.  Usted o su hijo no puede realizar movimientos intestinales o evacuar gases.  Usted o su hijo sufren nuseas o vmitos. EST SEGURO QUE:   Comprende las instrucciones para el alta mdica.  Controlar su enfermedad.  Solicitar atencin mdica de inmediato segn las indicaciones. Document Released: 10/06/2005 Document Revised: 12/29/2011 [facility] Patient Information 2014 [place], [place]. Quiste ovrico (Ovarian Cyst) Un quiste ovrico es una bolsa llena de lquido que se forma en el ovario. Los ovarios son los rganos pequeos que producen vulos en las mujeres. Se pueden formar varios tipos de [employer]. Katha Hamming no son cancerosos. Muchos de ellos no causan problemas y con frecuencia desaparecen solos. Algunos pueden provocar sntomas y requerir TEFL teacher. Los tipos ms comunes de quistes ovricos son los siguientes:  Quistes funcionales: estos quistes pueden aparecer todos los meses durante el ciclo menstrual. Esto es normal. Estos quistes suelen desaparecer con el prximo ciclo menstrual si la mujer no queda embarazada. En general, los quistes funcionales no tienen sntomas.  Endometriomas: estos quistes se forman a partir del tejido que recubre el tero. Tambin se denominan "quistes de chocolate" porque se llenan de sangre que se vuelve marrn. Este tipo de  quiste puede Psychologist, counselling en la zona inferior del abdomen durante la relacin sexual y con el perodo menstrual.  Cistoadenomas: este tipo se desarrolla a partir de las clulas que se [place] en el exterior del ovario. Estos quistes pueden ser muy grandes y causar dolor en la zona inferior del abdomen y durante la relacin sexual. Cleda Clarks tipo de quiste puede girar sobre s mismo, cortar el suministro de Retail buyer y causar un dolor intenso. Tambin se puede romper con facilidad y Physicist, medical.  Quistes dermoides: este tipo de quiste a veces se encuentra en ambos ovarios. Estos quistes pueden [employer] tipos de tejidos del organismo, como piel, dientes, pelo o TEFL teacher. Generalmente no tienen sntomas, a menos que sean [address]  Quistes tecalutenicos: aparecen cuando se produce demasiada cantidad de cierta hormona (gonadotropina corinica humana) que estimula en exceso al ovario para que produzca vulos. Esto es ms frecuente despus de procedimientos que ayudan a la concepcin de un beb (fertilizacin in vitro). CAUSAS   Los medicamentos para la fertilidad pueden provocar una afeccin mediante la cual se forman mltiples quistes de gran tamao en los ovarios. Esta se denomina sndrome de hiperestimulacin ovrica.  El sndrome del ovario poliqustico es una afeccin que puede causar desequilibrios hormonales, los cuales pueden dar como resultado quistes ovricos no funcionales. SIGNOS Y SNTOMAS  Muchos quistes ovricos no causan sntomas. Si se presentan sntomas, stos pueden ser:  Dolor o molestias en la pelvis.  Dolor en la parte baja del abdomen.  Dolor [employer] [place] [employer]  sexuales.  Aumento del permetro abdominal (hinchazn).  Perodos menstruales anormales.  Aumento del [employer] perodos [place].  Cese de los perodos menstruales sin estar embarazada. DIAGNSTICO  Estos quistes se descubren comnmente durante un examen de rutina o una exploracin  ginecolgica anual. Es posible que se ordenen otros estudios para obtener ms informacin sobre el [place]ona Walk. Estos estudios pueden ser:  Regulatory affairs officer.  Radiografas de la pelvis.  Tomografa computada.  Resonancia magntica.  Anlisis de [place]. TRATAMIENTO  Muchos de los quistes ovricos desaparecen por s solos, sin tratamiento. Es probable que el mdico quiera controlar el quiste regularmente durante 2 o para ver si se produce algn cambio. En el caso de las mujeres en la menopausia, es particularmente importante controlar de cerca al quiste ya que el ndice de cncer de ovario en las mujeres menopusicas es ms alto. Cuando se requiere TEFL teacher, este puede incluir cualquiera de los siguientes:  Un procedimiento para drenar el quiste (aspiracin). Esto se puede realizar [employer] uso de [place] grande y [place]. Tambin se puede hacer a travs de un procedimiento laparoscpico, En este procedimiento, se inserta un tubo delgado que emite luz y que tiene una pequea cmara en un extremo (laparoscopio) a travs de una pequea incisin.  Ciruga para extirpar el quiste completo. Esto se puede realizar mediante una ciruga laparoscpica o Neomia Dear ciruga abierta, la cual implica realizar una incisin ms grande en la parte inferior del abdomen.  Tratamiento hormonal o pldoras anticonceptivas. Estos mtodos a veces se usan para ayudar a Barista. INSTRUCCIONES PARA EL CUIDADO EN EL HOGAR   Tome solo medicamentos de venta libre o recetados, segn las indicaciones del mdico.  Oceanographer a las consultas de control con su mdico segn las indicaciones.  Hgase exmenes plvicos regulares y pruebas de [place]. SOLICITE ATENCIN MDICA SI:   Los perodos se atrasan, son irregulares, dolorosos o cesan.  El dolor plvico o abdominal no desaparece.  El abdomen se agranda o se hincha.  Siente presin en la vejiga o no puede vaciarla completamente.  Siente dolor  durante las [employer].  Tiene una sensacin de hinchazn, presin o Environmental manager.  Pierde peso sin razn aparente.  Siente un Engineer, maintenance (IT).  Est estreida.  Pierde el apetito.  Le aparece acn.  Nota un aumento del vello corporal y facial.  Lenora Boys de peso sin hacer modificaciones en su actividad fsica y en su dieta habitual.  Sospecha que est embarazada. SOLICITE ATENCIN MDICA DE INMEDIATO SI:   Siente cada vez ms dolor abdominal.  Tiene malestar estomacal (nuseas) y vomita.  Tiene fiebre que se presenta de [place] repentina.  Siente dolor abdominal al defecar.  Sus perodos menstruales son ms abundantes que lo habitual. Document Released: 07/16/2005 Document Revised: 07/27/2013 ExitCare Patient Information 2014 [place], [place].

## 2013-11-28 NOTE — Progress Notes (Deleted)
   Patient is a 38 year old who presented to the office today complaining over the past few days of dysuria and frequency. Patient denied any back pain, nausea, vomiting, chills, or fever. Patient denied any vaginal discharge. Patient in a monogamous relationship trying to conceive. Last menstrual period reported 10/31/2013.  Exam: Back: No CVA tenderness Abdomen: Soft nontender no rebound or guarding minimal suprapubic tenderness Pelvic exam not done  Urinalysis: WBC: Too numerous to count RBC: Too numerous to count Bacteria: Many  Assessment/plan: Urinary tract infection. Since patient is trying to conceive and we are not certain if she's pregnant at this time since her cycle should start next few days we will place her on a safe antibiotic which will cover Escherichia coli the most common organism and urinary tract infection. She will be placed on ampicillin 250 mg one by mouth twice a day for 7 days. She was instructed to increase her fluid intake. She develops fever, chills, nausea, vomiting or back pain she should report to the office immediately or the emergency room if after hours.

## 2013-11-28 NOTE — Progress Notes (Signed)
   Patient presented to the office today for followup. See previous note. Patient with small left ovarian cyst as well as menorrhagia and anemia. Patient had a Mirena IUD placed a couple months ago. Recently she was placed on Megace as well as Vibramycin shortly after placing her in IUD because of her continuous bleeding. She states that she is now asymptomatic very minimal spotting and she is currently taking NovaFerrum 1 tablespoon (5 mL) daily because she has trouble with oral iron tablets. Her ultrasound today:  Uterus measuring 9.6 0.7 x 5.1 cm with an endometrial stripe 11 mm. IUD was seen in the proper position. Right ovary small follicle measuring 29 x 16 mm. Left ovary with continued presence of thin wall echo free a vascular cyst measuring 41 x 3 3 x 36 mm average size 3.6 cm no change in size from scan of November 2014.  Assessment/plan: Patient with history of menorrhagia responding well with a Mirena IUD. She is on supplemental iron for her anemia. We discussed several options either to proceed with laparoscopic ovarian cystectomy or to follow up with ultrasound in 6 months because of the benign features of the cyst and no family history of ovarian cancer. We also discussed CA 125 not reliable in a premenopausal patient. The patient decided to followup in ultrasound in 6 months. Will check her CBC at that point as well. If she develops any symptoms such as pelvic pain between now and then will readdress at that point and intervene laparoscopically. I've given her literature formation in Spanish on laparoscopy, ovarian cyst, and on endometriosis.

## 2014-07-10 ENCOUNTER — Other Ambulatory Visit: Payer: Self-pay | Admitting: Gynecology

## 2014-07-10 DIAGNOSIS — N83209 Unspecified ovarian cyst, unspecified side: Secondary | ICD-10-CM

## 2014-07-10 DIAGNOSIS — N938 Other specified abnormal uterine and vaginal bleeding: Secondary | ICD-10-CM

## 2014-07-24 ENCOUNTER — Ambulatory Visit: Payer: BC Managed Care – PPO | Admitting: Gynecology

## 2014-07-24 ENCOUNTER — Other Ambulatory Visit: Payer: BC Managed Care – PPO

## 2014-07-24 ENCOUNTER — Ambulatory Visit (INDEPENDENT_AMBULATORY_CARE_PROVIDER_SITE_OTHER): Payer: BC Managed Care – PPO | Admitting: Gynecology

## 2014-07-24 ENCOUNTER — Encounter: Payer: Self-pay | Admitting: Gynecology

## 2014-07-24 ENCOUNTER — Ambulatory Visit (INDEPENDENT_AMBULATORY_CARE_PROVIDER_SITE_OTHER): Payer: BC Managed Care – PPO

## 2014-07-24 VITALS — BP 116/72

## 2014-07-24 DIAGNOSIS — N83209 Unspecified ovarian cyst, unspecified side: Secondary | ICD-10-CM

## 2014-07-24 DIAGNOSIS — N938 Other specified abnormal uterine and vaginal bleeding: Secondary | ICD-10-CM

## 2014-07-24 DIAGNOSIS — N83202 Unspecified ovarian cyst, left side: Secondary | ICD-10-CM

## 2014-07-24 DIAGNOSIS — N8320 Unspecified ovarian cysts: Secondary | ICD-10-CM

## 2014-07-24 NOTE — Patient Instructions (Signed)
Marcador tumoral CA-125 (CA-125 Tumor Marker) El CA-125 es un marcador tumoral que sirve para Scientist, physiological evolucin del cncer de ovario o de endometrio. PREPARACIN PARA EL ESTUDIO No se requiere Loss adjuster, chartered. HALLAZGOS NORMALES Adultos: 0a35unidades/ml (0a25miliunidades/l) Los rangos para los resultados normales pueden variar entre diferentes laboratorios y hospitales. Consulte siempre con su mdico despus de Production assistant, radio estudio para Artist significado de los [place] y si los valores se consideran "dentro de los lmites normales". SIGNIFICADO DEL ESTUDIO  El mdico leer los resultados y Heritage manager con usted sobre la importancia y el significado de los [place], as como las opciones de tratamiento y la necesidad de Education officer, environmental pruebas adicionales, si fuera necesario. OBTENCIN DE LOS RESULTADOS DE LAS PRUEBAS Es su responsabilidad retirar el resultado del [place]. Consulte en el laboratorio cundo y cmo podr [employer]. Document Released: 07/27/2013 [facility] Patient Information 2015 [place], [place]. This information is not intended to replace advice given to you by your health care provider. Make sure you discuss any questions you have with your health care provider. Laparoscopa diagnstica (Diagnostic Laparoscopy) La laparoscopa es un procedimiento quirrgico relativamente simple, de uso habitual y breve (menos de una hora) que se lleva a cabo para diagnosticar y tratar enfermedades del abdomen. El laparoscopio (tubo delgado, que emite luz, del tamao de un lpiz y similar a un telescopio) se inserta en el abdomen a travs de una pequea incisin (corte realizado por un cirujano). A travs de este instrumento, el profesional podr observar [employer] rganos del interior del abdomen (vientre) y ver si hay algo anormal. La laparoscopa podr llevarse a cabo tanto en el hospital como en un consultorio. Podrn administrarle un sedante suave que lo ayudar a  relajarse antes y durante el procedimiento. Una vez en la sala de operaciones, le administrarn una anestesia general (a menos que usted y el profesional elijan otro tipo de anestesia). Despus de la laparoscopa, que generalmente dura menos de Georgianne Fick, ser [employer] sala de recuperacin durante algunas horas. Cuando regrese a Pensions consultant, la Arts administrator [employer] y [place]. RIESGOS Y COMPLICACIONES Comparados con los beneficios, los riesgos de la laparoscopia son relativamente pocos. El Animal nutritionist con usted los riesgos antes del procedimiento. Algunos problemas que pueden ocurrir luego de la intervencin son:  Infecciones.  Hemorragias.  Puede ocurrir que se lesionen otros rganos.  Efectos secundarios de Higher education careers adviser. PROCEDIMIENTO Una vez que se encuentra anestesiado, el cirujano insufla el abdomen con un gas inofensivo (dixido de carbono) para Research officer, political party observacin de los rganos de la pelvis. El cirujano introduce el laparoscopio a travs de una pequea incisin en el ombligo o alrededor del mismo. Podr insertar otros instrumentos, como una sonda para mover los rganos o Education officer, environmental algn procedimiento a travs de otra pequea incisin.  En ocasiones se toma una biopsia (muestra de tejido) para un diagnstico ms preciso o para Consulting civil engineer. La biopsia consiste en tomar una pequea muestra de tejido durante la laparoscopia para enviarlo al patlogo (especialista en la observacin de clulas y muestras de tejido) y que lo examine en el microscopio para un diagnstico a nivel de los tejidos. DESPUES DEL PROCEDIMIENTO  Se libera el gas del abdomen.  Las incisiones se cierran con puntos (suturas). Debido a que las incisiones son pequeas (generalmente de menos de 1 cm) las molestias son mnimas luego del procedimiento. Es posible que sienta cierto Sales executive. Es consecuencia de Education officer, community del tubo [place]  se encontraba  dormido. Es posible que sienta algn dolor abdominal no muy intenso. Tambin podr sentir [employer] en las incisiones realizadas para insertar los instrumentos en el abdomen.  El tiempo de recuperacin es reducido, siempre que no haya habido complicaciones.  Har reposo en la sala de recuperacin hasta que se encuentre estable y se sienta bien. Si no aparecen complicaciones, podr regresar a su casa. AVERIGE LOS RESULTADOS DE SU ANLISIS Durante su visita no contar con todos los [employer]. En este caso, tenga otra entrevista con su mdico para conocerlos. No piense que el resultado es normal si no tiene noticias de su mdico o de la institucin mdica. Es Copy seguimiento de todos los [place]. INSTRUCCIONES PARA EL CUIDADO DOMICILIARIO  Tome todos los medicamentos tal como se le indic.  Utilice los medicamentos de venta libre o de prescripcin para Chief Technology Officer, Environmental health practitioner o la [place], segn se lo indique el profesional que lo asiste.  Reanude las actividades habituales cuando se le indique.  Es preferible que se duche y no tome baos de inmersin.  Reanude la actividad sexual luego de [place], o cuando lo autoricen.  No conduzca mientras se encuentre bajo los efectos de narcticos. SOLICITE ATENCIN MDICA SI:  Siente un dolor abdominal inexplicable.  Siente dolor en los hombros, en la regin de los tirantes.  Si se siente aturdido o siente que se va a Artist.  Siente escalofros.  Usted o su nio tienen una temperatura oral de ms de 38,9 C (102 F).  Observa un drenaje purulento (similar al pus) que proviene de alguna de las heridas.  Usted o su hijo no puede realizar movimientos intestinales o evacuar gases.  Usted o su hijo sufren nuseas o vmitos. EST SEGURO QUE:   Comprende las instrucciones para el alta mdica.  Controlar su enfermedad.  Solicitar atencin mdica de inmediato segn las indicaciones. Document  Released: 10/06/2005 Document Revised: 12/29/2011 [facility] Patient Information 2015 [place], [place]. This information is not intended to replace advice given to you by your health care provider. Make sure you discuss any questions you have with your health care provider. Quiste ovrico (Ovarian Cyst) Un quiste ovrico es una bolsa llena de lquido que se forma en el ovario. Los ovarios son los rganos pequeos que producen vulos en las mujeres. Se pueden formar varios tipos de [employer]. Katha Hamming no son cancerosos. Muchos de ellos no causan problemas y con frecuencia desaparecen solos. Algunos pueden provocar sntomas y requerir TEFL teacher. Los tipos ms comunes de quistes ovricos son los siguientes:  Quistes funcionales: estos quistes pueden aparecer todos los meses durante el ciclo menstrual. Esto es normal. Estos quistes suelen desaparecer con el prximo ciclo menstrual si la mujer no queda embarazada. En general, los quistes funcionales no tienen sntomas.  Endometriomas: estos quistes se forman a partir del tejido que recubre el tero. Tambin se denominan "quistes de chocolate" porque se llenan de sangre que se vuelve marrn. Este tipo de quiste puede Psychologist, counselling en la zona inferior del abdomen durante la relacin sexual y con el perodo menstrual.  Cistoadenomas: este tipo se desarrolla a partir de las clulas que se [place] en el exterior del ovario. Estos quistes pueden ser muy grandes y causar dolor en la zona inferior del abdomen y durante la relacin sexual. Cleda Clarks tipo de quiste puede girar sobre s mismo, cortar el suministro de Retail buyer y causar un dolor intenso. Tambin se puede romper con facilidad  y Physicist, medical.  Quistes dermoides: este tipo de quiste a veces se encuentra en ambos ovarios. Estos quistes pueden [employer] tipos de tejidos del organismo, como piel, dientes, pelo o TEFL teacher. Generalmente no tienen sntomas, a menos que sean [address]  Quistes tecalutenicos: aparecen cuando se produce demasiada cantidad de cierta hormona (gonadotropina corinica humana) que estimula en exceso al ovario para que produzca vulos. Esto es ms frecuente despus de procedimientos que ayudan a la concepcin de un beb (fertilizacin in vitro). CAUSAS   Los medicamentos para la fertilidad pueden provocar una afeccin mediante la cual se forman mltiples quistes de gran tamao en los ovarios. Esta se denomina sndrome de hiperestimulacin ovrica.  El sndrome del ovario poliqustico es una afeccin que puede causar desequilibrios hormonales, los cuales pueden dar como resultado quistes ovricos no funcionales. SIGNOS Y SNTOMAS  Muchos quistes ovricos no causan sntomas. Si se presentan sntomas, stos pueden ser:  Dolor o molestias en la pelvis.  Dolor en la parte baja del abdomen.  Dolor durante las [employer].  Aumento del permetro abdominal (hinchazn).  Perodos menstruales anormales.  Aumento del [employer] perodos [place].  Cese de los perodos menstruales sin estar embarazada. DIAGNSTICO  Estos quistes se descubren comnmente durante un examen de rutina o una exploracin ginecolgica anual. Es posible que se ordenen otros estudios para obtener ms informacin sobre el [place]. Estos estudios pueden ser:  Regulatory affairs officer.  Radiografas de la pelvis.  Tomografa computada.  Resonancia magntica.  Anlis[place] de [place]. TRATAMIENTO  Muchos de los quistes ovricos desaparecen por s solos, sin tratamiento. Es probable que el mdico quiera controlar el quiste regularmente durante 2 o para ver si se produce algn cambio. En el caso de las mujeres en la menopausia, es particularmente importante controlar de cerca al quiste ya que el ndice de cncer de ovario en las mujeres menopusicas es ms alto. Cuando se requiere TEFL teacher, este puede incluir cualquiera de los siguientes:  Un procedimiento para  drenar el quiste (aspiracin). Esto se puede realizar [employer] uso de [place] grande y [place]. Tambin se puede hacer a travs de un procedimiento laparoscpico, En este procedimiento, se inserta un tubo delgado que emite luz y que tiene una pequea cmara en un extremo (laparoscopio) a travs de una pequea incisin.  Ciruga para extirpar el quiste completo. Esto se puede realizar mediante una ciruga laparoscpica o Neomia Dear ciruga abierta, la cual implica realizar una incisin ms grande en la parte inferior del abdomen.  Tratamiento hormonal o pldoras anticonceptivas. Estos mtodos a veces se usan para ayudar a Barista. INSTRUCCIONES PARA EL CUIDADO EN EL HOGAR   Tome solo medicamentos de venta libre o recetados, segn las indicaciones del mdico.  Oceanographer a las consultas de control con su mdico segn las indicaciones.  Hgase exmenes plvicos regulares y pruebas de [place]apan[place]. SOLICITE ATENCIN MDICA SI:   Los perodos se atrasan, son irregulares, dolorosos o cesan.  El dolor plvico o abdominal no desaparece.  El abdomen se agranda o se hincha.  Siente presin en la vejiga o no puede vaciarla completamente.  Siente dolor durante las [employer].  Tiene una sensacin de hinchazn, presin o Environmental manager.  Pierde peso sin razn aparente.  Siente un Engineer, maintenance (IT).  Est estreida.  Pierde el apetito.  Le aparece acn.  Nota un aumento del vello corporal y facial.  Lenora Boys de peso sin hacer modificaciones en su actividad fsica y en su dieta  habitual.  Sospecha que est embarazada. SOLICITE ATENCIN MDICA DE INMEDIATO SI:   Siente cada vez ms dolor abdominal.  Tiene malestar estomacal (nuseas) y vomita.  Tiene fiebre que se presenta de [place] Citymanera repentina.  Siente dolor abdominal al defecar.  Sus perodos menstruales son ms abundantes que lo habitual. ASEGRESE DE QUE:   Comprende estas  instrucciones.  Controlar su afeccin.  Recibir ayuda de inmediato si no mejora o si empeora. Document Released: 07/16/2005 Document Revised: 10/11/2013 [facility] Patient Information 2015 [place]. This information is not intended to replace advice given to you by your health care provider. Make sure you discuss any questions you have with your health care provider.

## 2014-07-24 NOTE — Progress Notes (Addendum)
   The patient presented to the office today for her followup ultrasound in reference to a left ovarian cyst has been present since November 2014. Ultrasound report as follows:  November 2014: 4.1 x 3.0 x 3.0 cm March 2015: 4.1 x 3.3 x 3.6 cm 07/24/2014 4.1 x 3.4 x 4.2 cm  All scans had demonstrated that the cyst is thin-walled and echo free and totally avascular. Patient has the Mirena IUD that was placed in November of 2014. Patient also has had history of anemia in the past for which she's currently on iron supplementation and is now having normal very light menstrual cycles.  We discussed the findings of the ultrasound. Patient is otherwise asymptomatic. I recommended left ovarian cystectomy. She would like to wait until January. We'll run a CA 125 today and schedule her surgery for January but we will repeat the ultrasound the week before her surgery to make sure that the cyst has not resolved. We'll also do a complete annual exam preop same time. The limitations of the CA 125 were discussed. By the appearance this appears to be a benign appearing stable cysts. Patient understands I cannot give her any guarantees in wait until January she fully understands and accepts.

## 2014-07-25 LAB — CA 125: CA 125: 10 U/mL (ref <35)

## 2014-08-21 ENCOUNTER — Encounter: Payer: Self-pay | Admitting: Gynecology

## 2014-09-27 ENCOUNTER — Other Ambulatory Visit: Payer: Self-pay | Admitting: Gynecology

## 2014-09-27 ENCOUNTER — Telehealth: Payer: Self-pay

## 2014-09-27 DIAGNOSIS — N83202 Unspecified ovarian cyst, left side: Secondary | ICD-10-CM

## 2014-09-27 NOTE — Telephone Encounter (Signed)
I spoke with patient and scheduled her left ov cystectomy for 11/07/14.  Debarah CrapeClaudia will call her to set up her RGCE w/ u/s a week prior to surgery.  [facility].

## 2014-10-30 NOTE — Patient Instructions (Addendum)
   Your procedure is scheduled on: Tuesday, Jan 19   [facility]: 8:30 AM Pick up the phone at the desk and dial [phone] and inform us of your arrival.  Please call this number if you have any problems the morning of surgery: [phone]  Remember: Do not eat or drink after midnight: Monday Take these medicines the morning of surgery with a SIP OF WATER:  None  Do not wear jewelry, make-up, or FINGER nail polish No metal in your hair or on your body. Do not wear lotions, powders, perfumes.  You may wear deodorant.  Do not bring valuables to the hospital. Contacts, dentures or bridgework may not be worn into surgery.  Patients discharged on the day of surgery will not be allowed to drive home.  Home with husband Jed Limerickrmando cell [phone]

## 2014-10-31 ENCOUNTER — Encounter (HOSPITAL_COMMUNITY): Payer: Self-pay

## 2014-10-31 ENCOUNTER — Encounter (HOSPITAL_COMMUNITY)
Admission: RE | Admit: 2014-10-31 | Discharge: 2014-10-31 | Disposition: A | Discharge status: Home / Self Care | Payer: BLUE CROSS/BLUE SHIELD | Source: Ambulatory Visit | Attending: Gynecology | Admitting: Gynecology

## 2014-10-31 ENCOUNTER — Telehealth: Payer: Self-pay

## 2014-10-31 DIAGNOSIS — Z01818 Encounter for other preprocedural examination: Secondary | ICD-10-CM | POA: Insufficient documentation

## 2014-10-31 DIAGNOSIS — N8320 Unspecified ovarian cysts: Secondary | ICD-10-CM | POA: Insufficient documentation

## 2014-10-31 LAB — CBC
HCT: 36.3 % (ref 36.0–46.0)
HEMOGLOBIN: 11.8 g/dL — AB (ref 12.0–15.0)
MCH: 27.8 pg (ref 26.0–34.0)
MCHC: 32.5 g/dL (ref 30.0–36.0)
MCV: 85.6 fL (ref 78.0–100.0)
Platelets: 332 10*3/uL (ref 150–400)
RBC: 4.24 MIL/uL (ref 3.87–5.11)
RDW: 14.2 % (ref 11.5–15.5)
WBC: 6.5 10*3/uL (ref 4.0–10.5)

## 2014-10-31 NOTE — Telephone Encounter (Signed)
Not needed

## 2014-11-01 ENCOUNTER — Encounter: Payer: Self-pay | Admitting: Gynecology

## 2014-11-01 ENCOUNTER — Ambulatory Visit (INDEPENDENT_AMBULATORY_CARE_PROVIDER_SITE_OTHER): Payer: BLUE CROSS/BLUE SHIELD | Admitting: Gynecology

## 2014-11-01 ENCOUNTER — Ambulatory Visit (INDEPENDENT_AMBULATORY_CARE_PROVIDER_SITE_OTHER): Payer: BLUE CROSS/BLUE SHIELD

## 2014-11-01 VITALS — BP 130/86 | Ht 64.5 in | Wt 147.0 lb

## 2014-11-01 DIAGNOSIS — N7011 Chronic salpingitis: Secondary | ICD-10-CM | POA: Insufficient documentation

## 2014-11-01 DIAGNOSIS — N8320 Unspecified ovarian cysts: Secondary | ICD-10-CM

## 2014-11-01 DIAGNOSIS — N83202 Unspecified ovarian cyst, left side: Secondary | ICD-10-CM | POA: Insufficient documentation

## 2014-11-01 DIAGNOSIS — Z01818 Encounter for other preprocedural examination: Secondary | ICD-10-CM

## 2014-11-01 MED ORDER — METOCLOPRAMIDE HCL 10 MG PO TABS: 10.0000 mg | ORAL_TABLET | Freq: Three times a day (TID) | ORAL | Status: DC

## 2014-11-01 MED ORDER — OXYCODONE-ACETAMINOPHEN 5-325 MG PO TABS: 1.0000 | ORAL_TABLET | ORAL | Status: DC | PRN

## 2014-11-01 NOTE — Progress Notes (Signed)
Tina Garza is an 39 y.o. female for preoperative examination. Patient scheduled for l laparoscopic left ovarian cystectomy next week. Patient with persistent left ovarian cyst. Patient had the Mirena IUD placed in December 2014 but prior to that patient had the left ovarian cyst. Previous ultrasound findings as follows:   November 2014: 4.1 x 3.0 x 3.0 cm March 2015: 4.1 x 3.3 x 3.6 cm 07/24/2014 4.1 x 3.4 x 4.2 cm normal CA 125  Today's ultrasound: Left ovarian cyst 4.3 x 4.2 x 3.5 cm average size 4.0 cm avascular. Adjacent to the left ovary was free fluid noted. Uterus is normal size right ovary was otherwise normal. IUD was seen in the proper position.  Patient continues with left lower port and discomfort.  Pertinent Gynecological History: Menses: very light Bleeding: normal Contraception: IUD DES exposure: denies Blood transfusions: none Sexually transmitted diseases: no past history Previous GYN Procedures: 1 nsvd  Last mammogram: not indicated Date:not indicated Last pap: normal Date: 2014 OB History: G1, P1   Menstrual History: Menarche age: [age]  Patient's last menstrual period was 10/20/2014 (approximate).    Past Medical History  Diagnosis Date  . Anemia   . SVD (spontaneous vaginal delivery)     x 1    Past Surgical History  Procedure Laterality Date  . Breast surgery    . Augmentation mammaplasty      Family History  Problem Relation Age of Onset  . Diabetes Paternal Grandmother     Social History:  reports that she quit smoking about 23 years ago. Her smoking use included Cigarettes. She has a 1.25 pack-year smoking history. She has never used smokeless tobacco. She reports that she drinks alcohol. She reports that she does not use illicit drugs.  Allergies: No Known Allergies   (Not in a hospital admission)  REVIEW OF SYSTEMS: A ROS was performed and pertinent positives and negatives are included in the history.  GENERAL: No fevers or  chills. HEENT: No change in vision, no earache, sore throat or sinus congestion. NECK: No pain or stiffness. CARDIOVASCULAR: No chest pain or pressure. No palpitations. PULMONARY: No shortness of breath, cough or wheeze. GASTROINTESTINAL: No abdominal pain, nausea, vomiting or diarrhea, melena or bright red blood per rectum. GENITOURINARY: No urinary frequency, urgency, hesitancy or dysuria. MUSCULOSKELETAL: No joint or muscle pain, no back pain, no recent trauma. DERMATOLOGIC: No rash, no itching, no lesions. ENDOCRINE: No polyuria, polydipsia, no heat or cold intolerance. No recent change in weight. HEMATOLOGICAL: No anemia or easy bruising or bleeding. NEUROLOGIC: No headache, seizures, numbness, tingling or weakness. PSYCHIATRIC: No depression, no loss of interest in normal activity or change in sleep pattern.     Blood pressure 130/86, height 5' 4.5" (1.638 m), weight 147 lb (66.679 kg), last menstrual period 10/20/2014.  Physical Exam:  HEENT:unremarkable Neck:Supple, midline, no thyroid megaly, no carotid bruits Lungs:  Clear to auscultation no rhonchi's or wheezes Heart:Regular rate and rhythm, no murmurs or gallops Breast Exam:Symmetrical in appearance, saline implants, no palpable mass or tenderness no supraclavicular axillary lymphadenopathy  Abdomen:Soft nontender no rebound or guarding  Pelvic:BUSwithin normal limits  Vagina:No lesions or discharge  Cervix:IUD string visualized  Uterus:Anteverted will size shape and consistency  Adnexa:Left adnexal fullness and tenderness noted  Extremities: No cords, no edema Rectal:Not done  Results for orders placed or performed during the hospital encounter of 10/31/14 (from the past 24 hour(s))  CBC     Status: Abnormal   Collection Time: 10/31/14 10:33 AM  Result Value Ref Range   WBC 6.5 4.0 - 10.5 K/uL   RBC 4.24 3.87 - 5.11 MIL/uL   Hemoglobin 11.8 (L) 12.0 - 15.0 g/dL   HCT 16.1 09.6 - 04.5 %   MCV 85.6 78.0 - 100.0 fL   MCH 27.8  26.0 - 34.0 pg   MCHC 32.5 30.0 - 36.0 g/dL   RDW 40.9 81.1 - 91.4 %   Platelets 332 150 - 400 K/uL    No results found.  Assessment/Plan: Patient with persistent left ovarian cyst. Scheduled to undergo left ovarian cystectomy. Possible left salpingo-oophorectomy. Risk of surgery discussed with patient as follows:                         Patient was counseled as to the risk of surgery to include the following:  1. Infection (prohylactic antibiotics will be administered)  2. DVT/Pulmonary Embolism (prophylactic pneumo compression stockings will be used)  3.Trauma to internal organs requiring additional surgical procedure to repair any injury to     Internal organs requiring perhaps additional hospitalization days.  4.Hemmorhage requiring transfusion and blood products which carry risks such as             anaphylactic reaction, hepatitis and AIDS  Patient had received literature information on the procedure scheduled and all her questions were answered and fully accepts all risk.   [facility] [id number] AMTD@Note :    Ok Edwards 11/01/2014, 10:27 AM  Note: This dictation was prepared with  Dragon/digital dictation along withSmart phrase technology. Any transcriptional errors that result from this process are unintentional.

## 2014-11-06 MED ORDER — DEXTROSE 5 % IV SOLN
2.0000 g | INTRAVENOUS | Status: AC
  Administered 2014-11-07: 2 g via INTRAVENOUS
  Filled 2014-11-06: qty 2

## 2014-11-06 NOTE — Anesthesia Preprocedure Evaluation (Signed)
Anesthesia Evaluation  Patient identified by MRN, date of birth, ID band Patient awake    Reviewed: Allergy & Precautions, NPO status , Patient's Chart, lab work & pertinent test results  History of Anesthesia Complications Negative for: history of anesthetic complications  Airway Mallampati: II  TM Distance: >3 FB Neck ROM: Full    Dental no notable dental hx. (+) Dental Advisory Given   Pulmonary former smoker,  breath sounds clear to auscultation  Pulmonary exam normal       Cardiovascular Exercise Tolerance: Good negative cardio ROS  Rhythm:Regular Rate:Normal     Neuro/Psych negative neurological ROS  negative psych ROS   GI/Hepatic negative GI ROS, Neg liver ROS,   Endo/Other  negative endocrine ROS  Renal/GU negative Renal ROS  negative genitourinary   Musculoskeletal negative musculoskeletal ROS (+)   Abdominal   Peds negative pediatric ROS (+)  Hematology  (+) anemia ,   Anesthesia Other Findings   Reproductive/Obstetrics negative OB ROS                             Anesthesia Physical Anesthesia Plan  ASA: II  Anesthesia Plan: General   Post-op Pain Management:    Induction: Intravenous  Airway Management Planned: Oral ETT  Additional Equipment:   Intra-op Plan:   Post-operative Plan: Extubation in OR  Informed Consent: I have reviewed the patients History and Physical, chart, labs and discussed the procedure including the risks, benefits and alternatives for the proposed anesthesia with the patient or authorized representative who has indicated his/her understanding and acceptance.   Dental advisory given  Plan Discussed with: CRNA  Anesthesia Plan Comments:         Anesthesia Quick Evaluation

## 2014-11-07 ENCOUNTER — Ambulatory Visit (HOSPITAL_COMMUNITY)
Admission: RE | Admit: 2014-11-07 | Discharge: 2014-11-07 | Disposition: A | Discharge status: Home / Self Care | Payer: BLUE CROSS/BLUE SHIELD | Source: Ambulatory Visit | Attending: Gynecology | Admitting: Gynecology

## 2014-11-07 ENCOUNTER — Ambulatory Visit (HOSPITAL_COMMUNITY): Payer: BLUE CROSS/BLUE SHIELD | Admitting: Anesthesiology

## 2014-11-07 ENCOUNTER — Encounter (HOSPITAL_COMMUNITY)
Admission: RE | Disposition: A | Discharge status: Home / Self Care | Payer: Self-pay | Source: Ambulatory Visit | Attending: Gynecology

## 2014-11-07 DIAGNOSIS — Z87891 Personal history of nicotine dependence: Secondary | ICD-10-CM | POA: Insufficient documentation

## 2014-11-07 DIAGNOSIS — I82409 Acute embolism and thrombosis of unspecified deep veins of unspecified lower extremity: Secondary | ICD-10-CM | POA: Insufficient documentation

## 2014-11-07 DIAGNOSIS — N8320 Unspecified ovarian cysts: Secondary | ICD-10-CM | POA: Diagnosis not present

## 2014-11-07 HISTORY — PX: LAPAROSCOPIC OVARIAN CYSTECTOMY: SHX6248

## 2014-11-07 LAB — PREGNANCY, URINE: Preg Test, Ur: NEGATIVE

## 2014-11-07 SURGERY — EXCISION, CYST, OVARY, LAPAROSCOPIC
Anesthesia: General | Laterality: Left

## 2014-11-07 MED ORDER — BUPIVACAINE HCL (PF) 0.25 % IJ SOLN
INTRAMUSCULAR | Status: DC | PRN
  Administered 2014-11-07: 20 mL

## 2014-11-07 MED ORDER — FENTANYL CITRATE 0.05 MG/ML IJ SOLN
INTRAMUSCULAR | Status: AC
  Filled 2014-11-07: qty 5

## 2014-11-07 MED ORDER — ROCURONIUM BROMIDE 100 MG/10ML IV SOLN
INTRAVENOUS | Status: AC
  Filled 2014-11-07: qty 1

## 2014-11-07 MED ORDER — ONDANSETRON HCL 4 MG/2ML IJ SOLN
INTRAMUSCULAR | Status: DC | PRN
  Administered 2014-11-07: 4 mg via INTRAVENOUS

## 2014-11-07 MED ORDER — GLYCOPYRROLATE 0.2 MG/ML IJ SOLN
INTRAMUSCULAR | Status: DC | PRN
  Administered 2014-11-07: 0.4 mg via INTRAVENOUS

## 2014-11-07 MED ORDER — MIDAZOLAM HCL 2 MG/2ML IJ SOLN
INTRAMUSCULAR | Status: AC
  Filled 2014-11-07: qty 2

## 2014-11-07 MED ORDER — BUPIVACAINE HCL (PF) 0.25 % IJ SOLN
INTRAMUSCULAR | Status: AC
  Filled 2014-11-07: qty 30

## 2014-11-07 MED ORDER — NEOSTIGMINE METHYLSULFATE 10 MG/10ML IV SOLN
INTRAVENOUS | Status: AC
  Filled 2014-11-07: qty 1

## 2014-11-07 MED ORDER — OXYCODONE-ACETAMINOPHEN 5-325 MG PO TABS
1.0000 | ORAL_TABLET | ORAL | Status: DC | PRN
  Administered 2014-11-07: 1 via ORAL

## 2014-11-07 MED ORDER — ROCURONIUM BROMIDE 100 MG/10ML IV SOLN
INTRAVENOUS | Status: DC | PRN
  Administered 2014-11-07: 30 mg via INTRAVENOUS

## 2014-11-07 MED ORDER — NEOSTIGMINE METHYLSULFATE 10 MG/10ML IV SOLN
INTRAVENOUS | Status: DC | PRN
  Administered 2014-11-07: 2 mg via INTRAVENOUS

## 2014-11-07 MED ORDER — LACTATED RINGERS IR SOLN
Status: DC | PRN
  Administered 2014-11-07: 3000 mL

## 2014-11-07 MED ORDER — SILVER NITRATE-POT NITRATE 75-25 % EX MISC
CUTANEOUS | Status: DC | PRN
  Administered 2014-11-07: 7

## 2014-11-07 MED ORDER — DEXAMETHASONE SODIUM PHOSPHATE 4 MG/ML IJ SOLN
INTRAMUSCULAR | Status: AC
  Filled 2014-11-07: qty 1

## 2014-11-07 MED ORDER — KETOROLAC TROMETHAMINE 30 MG/ML IJ SOLN
INTRAMUSCULAR | Status: DC | PRN
  Administered 2014-11-07: 30 mg via INTRAVENOUS

## 2014-11-07 MED ORDER — FENTANYL CITRATE 0.05 MG/ML IJ SOLN
INTRAMUSCULAR | Status: DC | PRN
  Administered 2014-11-07 (×3): 50 ug via INTRAVENOUS
  Administered 2014-11-07: 100 ug via INTRAVENOUS

## 2014-11-07 MED ORDER — MIDAZOLAM HCL 2 MG/2ML IJ SOLN
INTRAMUSCULAR | Status: DC | PRN
  Administered 2014-11-07: 2 mg via INTRAVENOUS

## 2014-11-07 MED ORDER — KETOROLAC TROMETHAMINE 30 MG/ML IJ SOLN
INTRAMUSCULAR | Status: AC
  Filled 2014-11-07: qty 1

## 2014-11-07 MED ORDER — SCOPOLAMINE 1 MG/3DAYS TD PT72
MEDICATED_PATCH | TRANSDERMAL | Status: AC
  Administered 2014-11-07: 1.5 mg via TRANSDERMAL
  Filled 2014-11-07: qty 1

## 2014-11-07 MED ORDER — LACTATED RINGERS IV SOLN
INTRAVENOUS | Status: DC
  Administered 2014-11-07 (×2): via INTRAVENOUS

## 2014-11-07 MED ORDER — OXYCODONE-ACETAMINOPHEN 5-325 MG PO TABS
ORAL_TABLET | ORAL | Status: AC
  Administered 2014-11-07: 1 via ORAL
  Filled 2014-11-07: qty 1

## 2014-11-07 MED ORDER — PROPOFOL 10 MG/ML IV BOLUS
INTRAVENOUS | Status: AC
  Filled 2014-11-07: qty 20

## 2014-11-07 MED ORDER — LIDOCAINE HCL (CARDIAC) 20 MG/ML IV SOLN
INTRAVENOUS | Status: DC | PRN
  Administered 2014-11-07: 50 mg via INTRAVENOUS

## 2014-11-07 MED ORDER — FENTANYL CITRATE 0.05 MG/ML IJ SOLN
INTRAMUSCULAR | Status: DC
Start: 2014-11-07 — End: 2014-11-07
  Filled 2014-11-07: qty 2

## 2014-11-07 MED ORDER — HEPARIN SODIUM (PORCINE) 5000 UNIT/ML IJ SOLN
INTRAMUSCULAR | Status: AC
  Filled 2014-11-07: qty 1

## 2014-11-07 MED ORDER — 0.9 % SODIUM CHLORIDE (POUR BTL) OPTIME
TOPICAL | Status: DC | PRN
  Administered 2014-11-07: 1000 mL

## 2014-11-07 MED ORDER — ONDANSETRON HCL 4 MG/2ML IJ SOLN
INTRAMUSCULAR | Status: AC
  Filled 2014-11-07: qty 2

## 2014-11-07 MED ORDER — FENTANYL CITRATE 0.05 MG/ML IJ SOLN
25.0000 ug | INTRAMUSCULAR | Status: DC | PRN
  Administered 2014-11-07: 50 ug via INTRAVENOUS

## 2014-11-07 MED ORDER — PROPOFOL 10 MG/ML IV BOLUS
INTRAVENOUS | Status: DC | PRN
  Administered 2014-11-07: 150 mg via INTRAVENOUS

## 2014-11-07 MED ORDER — HEPARIN SODIUM (PORCINE) 5000 UNIT/ML IJ SOLN
INTRAMUSCULAR | Status: DC | PRN
  Administered 2014-11-07: 5000 [IU]

## 2014-11-07 MED ORDER — GLYCOPYRROLATE 0.2 MG/ML IJ SOLN
INTRAMUSCULAR | Status: AC
  Filled 2014-11-07: qty 2

## 2014-11-07 MED ORDER — DEXAMETHASONE SODIUM PHOSPHATE 10 MG/ML IJ SOLN
INTRAMUSCULAR | Status: DC | PRN
  Administered 2014-11-07: 4 mg via INTRAVENOUS

## 2014-11-07 MED ORDER — LIDOCAINE HCL (CARDIAC) 20 MG/ML IV SOLN
INTRAVENOUS | Status: AC
  Filled 2014-11-07: qty 5

## 2014-11-07 MED ORDER — ONDANSETRON HCL 4 MG/2ML IJ SOLN: 4.0000 mg | Freq: Once | INTRAMUSCULAR | Status: DC | PRN

## 2014-11-07 MED ORDER — SCOPOLAMINE 1 MG/3DAYS TD PT72
1.0000 | MEDICATED_PATCH | Freq: Once | TRANSDERMAL | Status: DC
  Administered 2014-11-07: 1.5 mg via TRANSDERMAL

## 2014-11-07 SURGICAL SUPPLY — 30 items
BARRIER ADHS 3X4 INTERCEED (GAUZE/BANDAGES/DRESSINGS) IMPLANT
BLADE SURG 11 STRL SS (BLADE) ×3 IMPLANT
CABLE HIGH FREQUENCY MONO STRZ (ELECTRODE) ×3 IMPLANT
CLOTH BEACON ORANGE TIMEOUT ST (SAFETY) ×3 IMPLANT
COVER MAYO STAND STRL (DRAPES) ×3 IMPLANT
DRSG COVADERM PLUS 2X2 (GAUZE/BANDAGES/DRESSINGS) ×6 IMPLANT
DRSG OPSITE POSTOP 3X4 (GAUZE/BANDAGES/DRESSINGS) ×3 IMPLANT
FILTER SMOKE EVAC LAPAROSHD (FILTER) ×3 IMPLANT
GLOVE BIOGEL PI IND STRL 8 (GLOVE) ×1 IMPLANT
GLOVE BIOGEL PI INDICATOR 8 (GLOVE) ×2
GLOVE ECLIPSE 7.5 STRL STRAW (GLOVE) ×6 IMPLANT
GOWN STRL REUS W/TWL LRG LVL3 (GOWN DISPOSABLE) ×6 IMPLANT
LIQUID BAND (GAUZE/BANDAGES/DRESSINGS) ×3 IMPLANT
NS IRRIG 1000ML POUR BTL (IV SOLUTION) ×3 IMPLANT
PACK LAPAROSCOPY BASIN (CUSTOM PROCEDURE TRAY) ×3 IMPLANT
PAD POSITIONER PINK NONSTERILE (MISCELLANEOUS) ×3 IMPLANT
POUCH SPECIMEN RETRIEVAL 10MM (ENDOMECHANICALS) IMPLANT
PROTECTOR NERVE ULNAR (MISCELLANEOUS) ×3 IMPLANT
SET IRRIG TUBING LAPAROSCOPIC (IRRIGATION / IRRIGATOR) ×3 IMPLANT
SHEARS HARMONIC ACE PLUS 36CM (ENDOMECHANICALS) ×3 IMPLANT
SLEEVE XCEL OPT CAN 5 100 (ENDOMECHANICALS) ×3 IMPLANT
SOLUTION ELECTROLUBE (MISCELLANEOUS) IMPLANT
SUT VIC AB 3-0 PS2 18 (SUTURE) ×2
SUT VIC AB 3-0 PS2 18XBRD (SUTURE) ×1 IMPLANT
SUT VICRYL 0 UR6 27IN ABS (SUTURE) ×3 IMPLANT
TOWEL OR 17X24 6PK STRL BLUE (TOWEL DISPOSABLE) ×6 IMPLANT
TRAY FOLEY CATH 14FR (SET/KITS/TRAYS/PACK) ×3 IMPLANT
TROCAR XCEL NON-BLD 11X100MML (ENDOMECHANICALS) ×3 IMPLANT
TROCAR XCEL NON-BLD 5MMX100MML (ENDOMECHANICALS) ×3 IMPLANT
WARMER LAPAROSCOPE (MISCELLANEOUS) ×3 IMPLANT

## 2014-11-07 NOTE — Anesthesia Postprocedure Evaluation (Signed)
  Anesthesia Post-op Note  Patient: Tina Garza  Procedure(s) Performed: Procedure(s) (LRB): LAPAROSCOPIC LEFT OVARIAN CYSTECTOMY AND PELVIC WASHINGS (Left)  Patient Location: PACU  Anesthesia Type: General  Level of Consciousness: awake and alert   Airway and Oxygen Therapy: Patient Spontanous Breathing  Post-op Pain: mild  Post-op Assessment: Post-op Vital signs reviewed, Patient's Cardiovascular Status Stable, Respiratory Function Stable, Patent Airway and No signs of Nausea or vomiting  Last Vitals:  Filed Vitals:   11/07/14 1200  BP: 98/49  Pulse: 79  Temp:   Resp: 39    Post-op Vital Signs: stable   Complications: No apparent anesthesia complications

## 2014-11-07 NOTE — Anesthesia Procedure Notes (Signed)
Procedure Name: Intubation Date/Time: 11/07/2014 10:12 AM Performed by: Shanon PayorGREGORY, Jakhi Dishman M Pre-anesthesia Checklist: Suction available, Emergency Drugs available, Timeout performed, Patient identified and Patient being monitored Patient Re-evaluated:Patient Re-evaluated prior to inductionOxygen Delivery Method: Circle system utilized Preoxygenation: Pre-oxygenation with 100% oxygen Intubation Type: IV induction Ventilation: Mask ventilation without difficulty Grade View: Grade I Tube type: Oral Tube size: 7.0 mm Number of attempts: 1 Airway Equipment and Method: Stylet Placement Confirmation: ETT inserted through vocal cords under direct vision,  positive ETCO2 and breath sounds checked- equal and bilateral Secured at: 20 cm Tube secured with: Tape Dental Injury: Teeth and Oropharynx as per pre-operative assessment

## 2014-11-07 NOTE — H&P (View-Only) (Signed)
 Tina Garza is an 39 y.o. female for preoperative examination. Patient scheduled for l laparoscopic left ovarian cystectomy next week. Patient with persistent left ovarian cyst. Patient had the Mirena IUD placed in December 2014 but prior to that patient had the left ovarian cyst. Previous ultrasound findings as follows:   November 2014: 4.1 x 3.0 x 3.0 cm March 2015: 4.1 x 3.3 x 3.6 cm 07/24/2014 4.1 x 3.4 x 4.2 cm normal CA 125  Today's ultrasound: Left ovarian cyst 4.3 x 4.2 x 3.5 cm average size 4.0 cm avascular. Adjacent to the left ovary was free fluid noted. Uterus is normal size right ovary was otherwise normal. IUD was seen in the proper position.  Patient continues with left lower port and discomfort.  Pertinent Gynecological History: Menses: very light Bleeding: normal Contraception: IUD DES exposure: denies Blood transfusions: none Sexually transmitted diseases: no past history Previous GYN Procedures: 1 nsvd  Last mammogram: not indicated Date:not indicated Last pap: normal Date: 2014 OB History: G1, P1   Menstrual History: Menarche age: 13  Patient's last menstrual period was 10/20/2014 (approximate).    Past Medical History  Diagnosis Date  . Anemia   . SVD (spontaneous vaginal delivery)     x 1    Past Surgical History  Procedure Laterality Date  . Breast surgery    . Augmentation mammaplasty      Family History  Problem Relation Age of Onset  . Diabetes Paternal Grandmother     Social History:  reports that she quit smoking about 23 years ago. Her smoking use included Cigarettes. She has a 1.25 pack-year smoking history. She has never used smokeless tobacco. She reports that she drinks alcohol. She reports that she does not use illicit drugs.  Allergies: No Known Allergies   (Not in a hospital admission)  REVIEW OF SYSTEMS: A ROS was performed and pertinent positives and negatives are included in the history.  GENERAL: No fevers or  chills. HEENT: No change in vision, no earache, sore throat or sinus congestion. NECK: No pain or stiffness. CARDIOVASCULAR: No chest pain or pressure. No palpitations. PULMONARY: No shortness of breath, cough or wheeze. GASTROINTESTINAL: No abdominal pain, nausea, vomiting or diarrhea, melena or bright red blood per rectum. GENITOURINARY: No urinary frequency, urgency, hesitancy or dysuria. MUSCULOSKELETAL: No joint or muscle pain, no back pain, no recent trauma. DERMATOLOGIC: No rash, no itching, no lesions. ENDOCRINE: No polyuria, polydipsia, no heat or cold intolerance. No recent change in weight. HEMATOLOGICAL: No anemia or easy bruising or bleeding. NEUROLOGIC: No headache, seizures, numbness, tingling or weakness. PSYCHIATRIC: No depression, no loss of interest in normal activity or change in sleep pattern.     Blood pressure 130/86, height 5' 4.5" (1.638 m), weight 147 lb (66.679 kg), last menstrual period 10/20/2014.  Physical Exam:  HEENT:unremarkable Neck:Supple, midline, no thyroid megaly, no carotid bruits Lungs:  Clear to auscultation no rhonchi's or wheezes Heart:Regular rate and rhythm, no murmurs or gallops Breast Exam:Symmetrical in appearance, saline implants, no palpable mass or tenderness no supraclavicular axillary lymphadenopathy  Abdomen:Soft nontender no rebound or guarding  Pelvic:BUSwithin normal limits  Vagina:No lesions or discharge  Cervix:IUD string visualized  Uterus:Anteverted will size shape and consistency  Adnexa:Left adnexal fullness and tenderness noted  Extremities: No cords, no edema Rectal:Not done  Results for orders placed or performed during the hospital encounter of 10/31/14 (from the past 24 hour(s))  CBC     Status: Abnormal   Collection Time: 10/31/14 10:33 AM    Result Value Ref Range   WBC 6.5 4.0 - 10.5 K/uL   RBC 4.24 3.87 - 5.11 MIL/uL   Hemoglobin 11.8 (L) 12.0 - 15.0 g/dL   HCT 36.3 36.0 - 46.0 %   MCV 85.6 78.0 - 100.0 fL   MCH 27.8  26.0 - 34.0 pg   MCHC 32.5 30.0 - 36.0 g/dL   RDW 14.2 11.5 - 15.5 %   Platelets 332 150 - 400 K/uL    No results found.  Assessment/Plan: Patient with persistent left ovarian cyst. Scheduled to undergo left ovarian cystectomy. Possible left salpingo-oophorectomy. Risk of surgery discussed with patient as follows:                         Patient was counseled as to the risk of surgery to include the following:  1. Infection (prohylactic antibiotics will be administered)  2. DVT/Pulmonary Embolism (prophylactic pneumo compression stockings will be used)  3.Trauma to internal organs requiring additional surgical procedure to repair any injury to     Internal organs requiring perhaps additional hospitalization days.  4.Hemmorhage requiring transfusion and blood products which carry risks such as             anaphylactic reaction, hepatitis and AIDS  Patient had received literature information on the procedure scheduled and all her questions were answered and fully accepts all risk.   Camaria Gerald HMD10:44 AMTD@Note:    Jamason Peckham H 11/01/2014, 10:27 AM  Note: This dictation was prepared with  Dragon/digital dictation along withSmart phrase technology. Any transcriptional errors that result from this process are unintentional.  

## 2014-11-07 NOTE — Transfer of Care (Signed)
Immediate Anesthesia Transfer of Care Note  Patient: Tina Garza  Procedure(s) Performed: Procedure(s): LAPAROSCOPIC LEFT OVARIAN CYSTECTOMY AND PELVIC WASHINGS (Left)  Patient Location: PACU  Anesthesia Type:General  Level of Consciousness: awake, alert  and oriented  Airway & Oxygen Therapy: Patient Spontanous Breathing and Patient connected to nasal cannula oxygen  Post-op Assessment: Report given to PACU RN and Post -op Vital signs reviewed and stable  Post vital signs: Reviewed and stable  Complications: No apparent anesthesia complications

## 2014-11-07 NOTE — Interval H&P Note (Signed)
History and Physical Interval Note:  11/07/2014 9:37 AM  Tina Garza  has presented today for surgery, with the diagnosis of LEFT OVARIAN CYST  The various methods of treatment have been discussed with the patient and family. After consideration of risks, benefits and other options for treatment, the patient has consented to  Procedure(s): LAPAROSCOPIC LEFT OVARIAN CYSTECTOMY (Left) as a surgical intervention .  The patient's history has been reviewed, patient examined, no change in status, stable for surgery.  I have reviewed the patient's chart and labs.  Questions were answered to the patient's satisfaction.   Addendum: Possible left salpingoophorectomy    Shalandra Leu H

## 2014-11-07 NOTE — Discharge Instructions (Signed)
DISCHARGE INSTRUCTIONS: Laparoscopy  The following instructions have been prepared to help you care for yourself upon your return home today.  MAY TAKE IBUPROFEN (MOTRIN, ADVIL) OR ALEVE AFTER 5:00 PM FOR CRAMPS!! MAY REMOVE PATCH BEHIND EAR IN 48 HOURS!!   Wound care:  Do not get the incision wet for the first 24 hours. The incision should be kept clean and dry.  The Band-Aids or dressings may be removed the day after surgery.  Should the incision become sore, red, and swollen after the first week, check with your doctor.  Personal hygiene:  Shower the day after your procedure.  Activity and limitations:  Do NOT drive or operate any equipment today.  Do NOT lift anything more than 15 pounds for 2-3 weeks after surgery.  Do NOT rest in bed all day.  Walking is encouraged. Walk each day, starting slowly with 5-minute walks 3 or 4 times a day. Slowly increase the length of your walks.  Walk up and down stairs slowly.  Do NOT do strenuous activities, such as golfing, playing tennis, bowling, running, biking, weight lifting, gardening, mowing, or vacuuming for 2-4 weeks. Ask your doctor when it is okay to start.  Diet: Eat a light meal as desired this evening. You may resume your usual diet tomorrow.  Return to work: This is dependent on the type of work you do. For the most part you can return to a desk job within a week of surgery. If you are more active at work, please discuss this with your doctor.  What to expect after your surgery: You may have a slight burning sensation when you urinate on the first day. You may have a very small amount of blood in the urine. Expect to have a small amount of vaginal discharge/light bleeding for 1-2 weeks. It is not unusual to have abdominal soreness and bruising for up to 2 weeks. You may be tired and need more rest for about 1 week. You may experience shoulder pain for 24-72 hours. Lying flat in bed may relieve it.  Call your doctor for  any of the following:  Develop a fever of 100.4 or greater  Inability to urinate 6 hours after discharge from hospital  Severe pain not relieved by pain medications  Persistent of heavy bleeding at incision site  Redness or swelling around incision site after a week  Increasing nausea or vomiting  Patient Signature________________________________________ Nurse Signature_________________________________________

## 2014-11-07 NOTE — Op Note (Addendum)
   Operative Note  11/07/2014  11:33 AM  PATIENT:  Tina Garza  39 y.o. female  PRE-OPERATIVE DIAGNOSIS:  LEFT OVARIAN CYST  POST-OPERATIVE DIAGNOSIS:  LEFT OVARIAN CYST  PROCEDURE:  Procedure(s): LAPAROSCOPIC LEFT OVARIAN CYSTECTOMY AND PELVIC WASHINGS  SURGEON:  Surgeon(s): Ok EdwardsJuan H Merril Isakson, MD Dara Lordsimothy P Fontaine, MD  ANESTHESIA:   general  FINDINGS: 4 x 5 cm left ovarian cyst which smooth surface. Contralateral ovary normal. Both fallopian tubes were normal with lush fimbriated Mann. Anterior and posterior cul-de-sac were free of endometriosis and adhesions. Normal gross appearance of appendix and liver surface and gallbladder.   DESCRIPTION OF OPERATION: The patient was taken to the operating room where she underwent a successful general endotracheal anesthesia. A timeout was undertaken in which the patient was properly identified and procedure to be performed was discussed out loud. Patient received Cefotan 2 g IV. Patient had PAS stockings for DVT prophylaxis. Patient's abdomen and perineum were prepped and draped in usual sterile fashion. Exam under anesthesia demonstrated anteverted uterus. A culprit tenaculum was placed inside the uterus for manipulation during laparoscopic surgery. Patient was placed in the low lithotomy position. After the drapes from place a curvilinear incision was made in the infra-umbilicus region. A 10/11 mm Optiview trocar was inserted. Approximately 3 L of carbon dioxide were insufflated into the peritoneal cavity. Under laparoscopic guidance 2 additional 5 mm ports were placed the patient's right and left lower abdomen. Systematic inspection of the pelvic and upper abdomen with findings as described above. Pelvic washings were obtained and submitted for cytological evaluation. The left utero-ovarian ligament was grasped with a nontraumatic laparoscopic grasper to be placed under tension and an effort to score the left ovarian capsule and enucleating the  clear cyst. A needle was placed and the cyst to help decompress it. Approximately 20 cc of amber-colored fluid was obtained and this was submitted for cytological evaluation as well. The remaining cyst wall was peeled off from the ovarian capsule and submitted separately for histological evaluation. Hemostasis was undertaken with Kleppinger bipolar forceps to the edges of the cyst wall. The pelvic cavity was then copiously irrigated with normal saline solution. The instruments were then removed. The subumbilical fascia was was approximated with a figure-of-eight of 0 Vicryl suture. All 3 port sites were reapproximated with Dermabond glue. For postoperative analgesia 0.25% Marcaine was infiltrated all 3 port sites for a total of 20 cc. Sponge count needle count were correct. The patient received 30 mg of Toradol IV and was transferred to recovery room with stable vital signs. Of note the Hulka tenaculum was removed as well as the Foley catheter.   ESTIMATED BLOOD LOSS: Minimal  Intake/Output Summary (Last 24 hours) at 11/07/14 1133 Last data filed at 11/07/14 1118  Gross per 24 hour  Intake   1300 ml  Output    300 ml  Net   1000 ml     BLOOD ADMINISTERED:none   LOCAL MEDICATIONS USED:  MARCAINE   0.25% 20 cc's SQ incision ports  SPECIMEN:  Source of Specimen:   Ovarian cyst wall, pelvic washings  DISPOSITION OF SPECIMEN:  PATHOLOGY  COUNTS:  YES  PLAN OF CARE: Transfer to PACU  [facility] Behavior[facility] Health SystemsFERNANDEZ,Jody Aguinaga HMD11:33 AMTD@  Note: This dictation was prepared with  Dragon/digital dictation along withSmart phrase technology. Any transcriptional errors that result from this process are unintentional.

## 2014-11-08 ENCOUNTER — Encounter (HOSPITAL_COMMUNITY): Payer: Self-pay | Admitting: Gynecology

## 2014-11-08 MED FILL — Heparin Sodium (Porcine) Inj 5000 Unit/ML: INTRAMUSCULAR | Qty: 1 | Status: AC

## 2014-11-22 ENCOUNTER — Encounter: Payer: Self-pay | Admitting: Gynecology

## 2014-11-22 ENCOUNTER — Ambulatory Visit (INDEPENDENT_AMBULATORY_CARE_PROVIDER_SITE_OTHER): Payer: BLUE CROSS/BLUE SHIELD | Admitting: Gynecology

## 2014-11-22 VITALS — BP 128/82

## 2014-11-22 DIAGNOSIS — Z09 Encounter for follow-up examination after completed treatment for conditions other than malignant neoplasm: Secondary | ICD-10-CM

## 2014-11-22 NOTE — Progress Notes (Signed)
   patient presented to the office today for HER-2 weeks postop visit. On 11/07/2014 patient underwent laparoscopic left ovarian cystectomy as a result of a left adnexal mass. Patient is doing well with no complaints today. Her pathology report as well as intraoperative findings and pictures were shown to the patient today as follows:   FINDINGS: 4 x 5 cm left ovarian cyst which smooth surface. Contralateral ovary normal. Both fallopian tubes were normal with lush fimbriated Mann. Anterior and posterior cul-de-sac were free of endometriosis and adhesions. Normal gross appearance of appendix and liver surface and gallbladder.  Pictures: Shown to patient  Pathology report: Diagnosis Ovary, cyst, left ovarian cyst wall BENIGN SEROUS CYST, NO ATYPIA OR MALIGNANCY.  Diagnosis OVARY , CYST FLUID, LEFT(SPECIMEN 2 OF 2 COLLECTED 11/07/14): BENIGN LOW COLUMNAR EPITHELIUM, NO ATYPIA OR MALIGNANC  Exam: Abdomen soft nontender no rebound or guarding port sites completely healed scab from Dermabond glue removed. Pelvic: Bartholin urethra Skene was within normal limits Vagina: No lesions or discharge Cervix: No lesions or discharge IUD string visualized Uterus: Anteverted normal size shape and consistency Adnexa: No masses or tenderness Rectal exam: Not done  Assessment/plan: Patient 2 weeks status post laparoscopic left ovarian cystectomy for benign serous cyst doing well may resume full normal activity and will return back to the office in one year or when necessary.

## 2015-12-14 ENCOUNTER — Other Ambulatory Visit (HOSPITAL_COMMUNITY)
Admission: RE | Admit: 2015-12-14 | Discharge: 2015-12-14 | Disposition: A | Discharge status: Home / Self Care | Payer: BLUE CROSS/BLUE SHIELD | Source: Ambulatory Visit | Attending: Gynecology | Admitting: Gynecology

## 2015-12-14 ENCOUNTER — Telehealth: Payer: Self-pay | Admitting: *Deleted

## 2015-12-14 ENCOUNTER — Encounter: Payer: Self-pay | Admitting: Gynecology

## 2015-12-14 ENCOUNTER — Ambulatory Visit (INDEPENDENT_AMBULATORY_CARE_PROVIDER_SITE_OTHER): Payer: Self-pay | Admitting: Gynecology

## 2015-12-14 VITALS — BP 122/74 | Ht 65.0 in | Wt 137.0 lb

## 2015-12-14 DIAGNOSIS — N644 Mastodynia: Secondary | ICD-10-CM

## 2015-12-14 DIAGNOSIS — Z8639 Personal history of other endocrine, nutritional and metabolic disease: Secondary | ICD-10-CM

## 2015-12-14 DIAGNOSIS — Z01419 Encounter for gynecological examination (general) (routine) without abnormal findings: Secondary | ICD-10-CM | POA: Insufficient documentation

## 2015-12-14 NOTE — Progress Notes (Signed)
Tina Garza April 25, 1976 [id number]   History:    40 y.o.  for annual gyn exam with complaint of increased left breast sensitivity in the upper outer quadrant. She sometimes feel this intrinsic pruritus. She reports no masses on either breast or any nipple discharge. She did have saline implants placed in the year 2000. She has no family history of any breast cancer. Review of patient's record indicated in January 2016 patient underwent a laparoscopic left ovarian cystectomy for a benign serous cyst and has done well. She currently has a Mirena IUD that was placed in 2014 and has a regular cycle every month. Patient with no previous history of any abnormal Pap smear. She does have past history vitamin D deficiency and anemia in the past for which she was treated and currently on no medication. She is not fasting today and started her menstrual cycle.  Past medical history,surgical history, family history and social history were all reviewed and documented in the EPIC chart.  Gynecologic History Patient's last menstrual period was 12/08/2015. Contraception: IUD Last Pap: 2014. Results were: normal Last mammogram: Not indicated. Results were: Not indicated  Obstetric History OB History  Gravida Para Term Preterm AB SAB TAB Ectopic Multiple Living  # Outcome Date GA Lbr Len/2nd Weight Sex Delivery Anes PTL Lv  1 Para                ROS: A ROS was performed and pertinent positives and negatives are included in the history.  GENERAL: No fevers or chills. HEENT: No change in vision, no earache, sore throat or sinus congestion. NECK: No pain or stiffness. CARDIOVASCULAR: No chest pain or pressure. No palpitations. PULMONARY: No shortness of breath, cough or wheeze. GASTROINTESTINAL: No abdominal pain, nausea, vomiting or diarrhea, melena or bright red blood per rectum. GENITOURINARY: No urinary frequency, urgency, hesitancy or dysuria. MUSCULOSKELETAL: No joint or muscle pain,  no back pain, no recent trauma. DERMATOLOGIC: No rash, no itching, no lesions. ENDOCRINE: No polyuria, polydipsia, no heat or cold intolerance. No recent change in weight. HEMATOLOGICAL: No anemia or easy bruising or bleeding. NEUROLOGIC: No headache, seizures, numbness, tingling or weakness. PSYCHIATRIC: No depression, no loss of interest in normal activity or change in sleep pattern.     Exam: chaperone present  BP 122/74 mmHg  Ht  (1.651 m)  Wt 137 lb (62.143 kg)  BMI 22.80 kg/m2  LMP 12/08/2015  Body mass index is 22.8 kg/(m^2).  General appearance : Well developed well nourished female. No acute distress HEENT: Eyes: no retinal hemorrhage or exudates,  Neck supple, trachea midline, no carotid bruits, no thyroidmegaly Lungs: Clear to auscultation, no rhonchi or wheezes, or rib retractions  Heart: Regular rate and rhythm, no murmurs or gallops Breast:Examined in sitting and supine position were symmetrical in appearance, no palpable masses or tenderness,  no skin retraction, no nipple inversion, no nipple discharge, no skin discoloration, no axillary or supraclavicular lymphadenopathy Abdomen: no palpable masses or tenderness, no rebound or guarding Extremities: no edema or skin discoloration or tenderness  Pelvic:  Bartholin, Urethra, Skene Glands: Within normal limits             Vagina: No gross lesions or discharge, vaginal blood present  Cervix: No gross lesions or discharge, IUD string seen  Uterus  anteverted, normal size, shape and consistency, non-tender and mobile  Adnexa  Without masses or tenderness  Anus and perineum  normal   Rectovaginal  normal sphincter tone without palpated masses or tenderness             Hemoccult not indicated     Assessment/Plan:  40 y.o. female for annual exam with normal exam but due to patient's symptoms of increased breast tenderness and internal pruritic sensations especially for her menses I'm going to order a diagnostic mammogram  to make sure that her implant may not be leaking. She'll return back to the office next week for the following screening fasting blood work: Comprehensive metabolic panel, fasting lipid profile, TSH, CBC, and urinalysis. Pap smear was done today. Because of her past history vitamin D deficiency will check her vitamin D level as well.   Ok Edwards MD, 11:23 AM 12/14/2015

## 2015-12-14 NOTE — Telephone Encounter (Signed)
Orders placed at breast center they will contact pt to schedule. 

## 2015-12-14 NOTE — Addendum Note (Signed)
Addended by: Berna Spare A on: 12/14/2015 11:31 AM   Modules accepted: Orders, SmartSet

## 2015-12-14 NOTE — Telephone Encounter (Signed)
-----   Message from Ok Edwards, MD sent at 12/14/2015 11:09 AM EST ----- Please schedule diagnostic mammogram for this patient with saline implant and c/0 tenderness left breast outer upper quadrant

## 2015-12-15 LAB — URINALYSIS W MICROSCOPIC + REFLEX CULTURE
BACTERIA UA: NONE SEEN [HPF]
Bilirubin Urine: NEGATIVE
CASTS: NONE SEEN [LPF]
Crystals: NONE SEEN [HPF]
GLUCOSE, UA: NEGATIVE
HGB URINE DIPSTICK: NEGATIVE
Ketones, ur: NEGATIVE
Leukocytes, UA: NEGATIVE
Nitrite: NEGATIVE
PROTEIN: NEGATIVE
RBC / HPF: NONE SEEN RBC/HPF (ref <=2)
Specific Gravity, Urine: 1.025 (ref 1.001–1.035)
Squamous Epithelial / LPF: NONE SEEN [HPF] (ref <=5)
WBC, UA: NONE SEEN WBC/HPF (ref <=5)
YEAST: NONE SEEN [HPF]
pH: 6 (ref 5.0–8.0)

## 2015-12-17 ENCOUNTER — Other Ambulatory Visit: Payer: Self-pay

## 2015-12-17 LAB — CBC WITH DIFFERENTIAL/PLATELET
BASOS ABS: 0.1 10*3/uL (ref 0.0–0.1)
Basophils Relative: 1 % (ref 0–1)
EOS ABS: 0.1 10*3/uL (ref 0.0–0.7)
EOS PCT: 2 % (ref 0–5)
HCT: 37.4 % (ref 36.0–46.0)
Hemoglobin: 11.9 g/dL — ABNORMAL LOW (ref 12.0–15.0)
LYMPHS ABS: 2 10*3/uL (ref 0.7–4.0)
LYMPHS PCT: 37 % (ref 12–46)
MCH: 28 pg (ref 26.0–34.0)
MCHC: 31.8 g/dL (ref 30.0–36.0)
MCV: 88 fL (ref 78.0–100.0)
MPV: 9.4 fL (ref 8.6–12.4)
Monocytes Absolute: 0.4 10*3/uL (ref 0.1–1.0)
Monocytes Relative: 7 % (ref 3–12)
Neutro Abs: 2.8 10*3/uL (ref 1.7–7.7)
Neutrophils Relative %: 53 % (ref 43–77)
Platelets: 322 10*3/uL (ref 150–400)
RBC: 4.25 MIL/uL (ref 3.87–5.11)
RDW: 13.7 % (ref 11.5–15.5)
WBC: 5.3 10*3/uL (ref 4.0–10.5)

## 2015-12-17 LAB — COMPREHENSIVE METABOLIC PANEL
ALT: 9 U/L (ref 6–29)
AST: 11 U/L (ref 10–30)
Albumin: 3.9 g/dL (ref 3.6–5.1)
Alkaline Phosphatase: 53 U/L (ref 33–115)
BUN: 16 mg/dL (ref 7–25)
CO2: 25 mmol/L (ref 20–31)
Calcium: 8.7 mg/dL (ref 8.6–10.2)
Chloride: 104 mmol/L (ref 98–110)
Creat: 0.62 mg/dL (ref 0.50–1.10)
GLUCOSE: 91 mg/dL (ref 65–99)
POTASSIUM: 4.1 mmol/L (ref 3.5–5.3)
Sodium: 140 mmol/L (ref 135–146)
Total Bilirubin: 0.6 mg/dL (ref 0.2–1.2)
Total Protein: 6.7 g/dL (ref 6.1–8.1)

## 2015-12-17 LAB — LIPID PANEL
CHOL/HDL RATIO: 2.4 ratio (ref <=5.0)
Cholesterol: 137 mg/dL (ref 125–200)
HDL: 57 mg/dL (ref >=46)
LDL Cholesterol: 67 mg/dL (ref <130)
Triglycerides: 67 mg/dL (ref <150)
VLDL: 13 mg/dL (ref <30)

## 2015-12-17 LAB — CYTOLOGY - PAP

## 2015-12-17 NOTE — Telephone Encounter (Signed)
Appointment 12/20/15 @ 10:40am at breast center

## 2015-12-18 LAB — VITAMIN D 25 HYDROXY (VIT D DEFICIENCY, FRACTURES): VIT D 25 HYDROXY: 23 ng/mL — AB (ref 30–100)

## 2015-12-19 ENCOUNTER — Encounter: Payer: Self-pay | Admitting: Gynecology

## 2015-12-19 ENCOUNTER — Other Ambulatory Visit: Payer: Self-pay | Admitting: Gynecology

## 2015-12-19 DIAGNOSIS — E559 Vitamin D deficiency, unspecified: Secondary | ICD-10-CM

## 2015-12-19 MED ORDER — VITAMIN D (ERGOCALCIFEROL) 1.25 MG (50000 UNIT) PO CAPS: 50000.0000 [IU] | ORAL_CAPSULE | ORAL | Status: DC

## 2015-12-20 ENCOUNTER — Ambulatory Visit
Admission: RE | Admit: 2015-12-20 | Discharge: 2015-12-20 | Disposition: A | Discharge status: Home / Self Care | Payer: No Typology Code available for payment source | Source: Ambulatory Visit | Attending: Gynecology | Admitting: Gynecology

## 2015-12-20 DIAGNOSIS — N644 Mastodynia: Secondary | ICD-10-CM

## 2016-03-08 ENCOUNTER — Other Ambulatory Visit: Payer: Self-pay | Admitting: Gynecology

## 2016-12-15 ENCOUNTER — Encounter: Payer: Self-pay | Admitting: Gynecology

## 2016-12-15 ENCOUNTER — Ambulatory Visit (INDEPENDENT_AMBULATORY_CARE_PROVIDER_SITE_OTHER): Payer: Self-pay | Admitting: Gynecology

## 2016-12-15 VITALS — BP 120/82 | Ht 65.0 in | Wt 146.0 lb

## 2016-12-15 DIAGNOSIS — Z8639 Personal history of other endocrine, nutritional and metabolic disease: Secondary | ICD-10-CM

## 2016-12-15 DIAGNOSIS — Z01411 Encounter for gynecological examination (general) (routine) with abnormal findings: Secondary | ICD-10-CM

## 2016-12-15 DIAGNOSIS — Z23 Encounter for immunization: Secondary | ICD-10-CM

## 2016-12-15 NOTE — Progress Notes (Signed)
Tina Garza Tina Garza Jan 05, 1976 [id number]   History:    41 y.o.  for annual gyn exam with no complaints today. Review of patient's record indicated last year she was diagnosed with vitamin D deficiency and was treated for 3 months but did not return for follow-up vitamin D level. She had a Mirena IUD that was placed in 2014 and has done well with a very light regular cycles. Also review of her record indicated that in 2016 patient with a laparoscopic left ovarian cystectomy and pathology report demonstrated the following:  Diagnosis Ovary, cyst, left ovarian cyst wall BENIGN SEROUS CYST, NO ATYPIA OR MALIGNANCY.  Diagnosis OVARY , CYST FLUID, LEFT(SPECIMEN 2 OF 2 COLLECTED 11/07/14): BENIGN LOW COLUMNAR EPITHELIUM, NO ATYPIA OR MALIGNANCy  Patient has done well from her surgery. Patient with no past history of any abnormal Pap smears. Patient with history of saline implants.  Past medical history,surgical history, family history and social history were all reviewed and documented in the EPIC chart.  Gynecologic History Patient's last menstrual period was 11/21/2016 (approximate). Contraception: IUD Last Pap: 2017. Results were: normal Last mammogram: Not indicated. Results were: normal  Obstetric History OB History  Gravida Para Term Preterm AB Living  1 1       1   SAB TAB Ectopic Multiple Live Births               # Outcome Date GA Lbr Len/2nd Weight Sex Delivery Anes PTL Lv  1 Para                ROS: A ROS was performed and pertinent positives and negatives are included in the history.  GENERAL: No fevers or chills. HEENT: No change in vision, no earache, sore throat or sinus congestion. NECK: No pain or stiffness. CARDIOVASCULAR: No chest pain or pressure. No palpitations. PULMONARY: No shortness of breath, cough or wheeze. GASTROINTESTINAL: No abdominal pain, nausea, vomiting or diarrhea, melena or bright red blood per rectum. GENITOURINARY: No urinary frequency, urgency,  hesitancy or dysuria. MUSCULOSKELETAL: No joint or muscle pain, no back pain, no recent trauma. DERMATOLOGIC: No rash, no itching, no lesions. ENDOCRINE: No polyuria, polydipsia, no heat or cold intolerance. No recent change in weight. HEMATOLOGICAL: No anemia or easy bruising or bleeding. NEUROLOGIC: No headache, seizures, numbness, tingling or weakness. PSYCHIATRIC: No depression, no loss of interest in normal activity or change in sleep pattern.     Exam: chaperone present  BP 120/82   Ht 5\' 5"  (1.651 m)   Wt 146 lb (66.2 kg)   LMP 11/21/2016 (Approximate)   BMI 24.30 kg/m   Body mass index is 24.3 kg/m.  General appearance : Well developed well nourished female. No acute distress HEENT: Eyes: no retinal hemorrhage or exudates,  Neck supple, trachea midline, no carotid bruits, no thyroidmegaly Lungs: Clear to auscultation, no rhonchi or wheezes, or rib retractions  Heart: Regular rate and rhythm, no murmurs or gallops Breast:Examined in sitting and supine position were symmetrical in appearance, no palpable masses or tenderness,  no skin retraction, no nipple inversion, no nipple discharge, no skin discoloration, no axillary or supraclavicular lymphadenopathy Abdomen: no palpable masses or tenderness, no rebound or guarding Extremities: no edema or skin discoloration or tenderness  Pelvic:  Bartholin, Urethra, Skene Glands: Within normal limits             Vagina: No gross lesions or discharge  Cervix: No gross lesions or discharge  Uterus  anteverted, normal size, shape and consistency, non-tender  and mobile  Adnexa  Without masses or tenderness  Anus and perineum  normal   Rectovaginal  normal sphincter tone without palpated masses or tenderness             Hemoccult not indicated     Assessment/Plan:  41 y.o. female for annual exam with past history vitamin D deficiency. We'll check a vitamin D level later this week which she returns to the office in a fasting state for the  following screening blood work: Comprehensive metabolic panel, fasting lipid profile, TSH, CBC, and urinalysis. Pap smear was not done today. Patient did receive the flu vaccine.   Ok EdwardsFERNANDEZ,Shoji Pertuit H MD, 11:38 AM 12/15/2016

## 2016-12-15 NOTE — Patient Instructions (Signed)
Influenza Virus Vaccine (Flucelvax) Qu es este medicamento? La VACUNA ANTIGRIPAL ayuda a disminuir el riesgo de contraer la influenza, tambin conocida como la gripe. La vacuna solo ayuda a protegerle contra algunas cepas de influenza. MARCAS COMUNES: FLUCELVAX Qu le debo informar a mi profesional de la salud antes de tomar este medicamento? Necesita saber si usted presenta alguno de los siguientes problemas o situaciones: -trastorno de sangrado como hemofilia -fiebre o infeccin -sndrome de Guillain-Barre u otros problemas neurolgicos -problemas del sistema inmunolgico -infeccin por el virus de la inmunodeficiencia humana (VIH) o SIDA -niveles bajos de plaquetas en la sangre -esclerosis mltiple -una reaccin alrgica o inusual a las vacunas antigripales, a otros medicamentos, alimentos, colorantes o conservantes -si est embarazada o buscando quedar embarazada -si est amamantando a un beb Cmo debo utilizar este medicamento? Esta vacuna se administra mediante inyeccin por va intramuscular. Lo administra un profesional de [employer]. Recibir una copia de informacin escrita sobre la vacuna antes de cada vacuna. Asegrese de leer este folleto cada vez cuidadosamente. Este folleto puede cambiar con frecuencia. Hable con su pediatra para informarse acerca del uso de este medicamento en nios. Puede requerir atencin especial. Qu sucede si me Azucena Fallen dosis? No se aplica en este caso. Qu puede interactuar con este medicamento? -quimioterapia o radioterapia -medicamentos que suprimen el sistema inmunolgico, tales como etanercept, anakinra, infliximab y adalimumab -medicamentos que tratan o previenen cogulos sanguneos, como warfarina -fenitona -medicamentos esteroideos, como la prednisona o la cortisona -teofilina -vacunas A qu debo estar atento al usar [employer]? Informe a su mdico o a Producer, television/film/video de la [employer] todos los efectos secundarios que  persistan despus de [address]. Llame a su proveedor de atencin mdica si se presentan sntomas inusuales dentro de las 6 semanas de recibir esta vacuna. Es posible que todava pueda contraer la gripe, pero la enfermedad no ser tan fuerte como normalmente. No puede contraer la gripe de esta vacuna. La vacuna antigripal no le protege contra resfros u otras enfermedades que pueden causar [place]. Debe vacunarse cada ao. Qu efectos secundarios puedo tener al [employer] este medicamento? Efectos secundarios que debe informar a su mdico o a Producer, television/film/video de la salud tan pronto como sea posible: -Therapist, art como erupcin cutnea, picazn o urticarias, hinchazn de la cara, labios o lengua Efectos secundarios que, por lo general, no requieren atencin mdica (debe informarlos a su mdico o a su profesional de la salud si persisten o si son molestos): -fiebre -dolor de cabeza -molestias y dolores musculares -dolor, sensibilidad, enrojecimiento o hinchazn en el lugar de la inyeccin -cansancio Dnde debo guardar mi medicina? Esta vacuna se administrar por un profesional de la salud en una [place], Corporate investment banker, consultorio mdico u otro consultorio de un profesional de la salud. No se le suministrar esta vacuna para guardar en su domicilio.  2017 Elsevier/Gold Standard (2011-09-22 16:29:16) Deficiencia de vitaminaD (Vitamin D Deficiency) La deficiencia de vitaminaD ocurre cuando el organismo no tiene suficiente vitaminaD. La vitaminaD es importante para el organismo por muchos motivos:  Ayuda al organismo a Insurance underwriter minerales llamados calcio y fsforo.  Desempea un papel en la salud de los [place].  Puede ayudar a prevenir [employer], como la diabetes y Chartered loss adjuster.  Desempea un papel en la funcin muscular, incluida la actividad cardaca. Para obtener vitaminaD, puede hacer lo siguiente:  Consuma alimentos que contengan naturalmente  vitaminaD.  Consuma o beba leche u otros productos lcteos con agregado de vitaminaD.  Tome un suplemento  de vitaminaD o un suplemento multivitamnico que la contenga.  Expngase al sol. El organismo produce vitaminaD de forma natural cuando se expone la piel a la luz del sol. El organismo transforma la luz del sol en una forma de vitamina que puede Pooleutilizar. Si la deficiencia de vitaminaD es grave, puede causar una enfermedad que Sanmina-SCIreblandece los huesos. En los adultos, se la conoce como osteomalacia. En los nios, recibe el nombre de raquitismo. CAUSAS La deficiencia de vitaminaD puede deberse a lo siguiente:  No comer suficientes alimentos que contengan vitamina D.  Exposicin insuficiente al sol.  Sufrir Theatre managerciertas enfermedades del sistema digestivo que le dificultan al organismo la absorcin de vitaminaD. Estas enfermedades incluyen la enfermedad de Crohn, la pancreatitis crnica y la fibrosis [place] Islandsqustica.  Someterse a una Edison Internationalciruga en la que se extirpa Neomia Dearuna parte del estmago o del intestino delgado.  Ser obeso.  Tener enfermedad renal crnica o enfermedad heptica. FACTORES DE RIESGO Es ms probable que esta afeccin se manifieste en:  Las personas de edad Canbyavanzada.  Las Eli Lilly and Companypersonas que no pasan mucho tiempo al OGE Energyaire libre.  Las personas que viven en un centro de atencin de Glass blower/designerlarga estancia.  Las personas que sufrieron fracturas seas.  Las personas que tienen huesos dbiles o delgados (osteoporosis).  Las personas que sufren una enfermedad o un trastorno que modifica la forma en que el organismo absorbe la vitaminaD.  Las personas de Albertson'spiel oscura.  Las personas que toman algunos medicamentos, como corticoides o determinados anticonvulsivos.  Las personas con sobrepeso u obesidad. SNTOMAS En los casos leves de deficiencia de vitaminaD, puede no haber sntomas. Si el trastorno es grave, los sntomas pueden incluir lo siguiente:  Kerr-McGeeDolor en los huesos.  Dolor  muscular.  Caerse con frecuencia.  Huesos fracturados por Futures traderuna lesin menor. DIAGNSTICO Por lo general, este trastorno se diagnostica mediante un anlisis de [place] Har[place]angre. TRATAMIENTO El tratamiento de este trastorno puede depender de la causa. Entre las otras opciones de Auburntratamiento, se incluyen las siguientes:  Tomar suplementos de vitamina D.  Tomar un suplemento de calcio. El Social workermdico le sugerir cul es la dosis ms [place] Jan May[place] Islandsadecuada para usted. INSTRUCCIONES PARA EL CUIDADO EN EL HOGAR  Tome los medicamentos y los suplementos solamente como se lo haya indicado el mdico.  Consuma alimentos que contengan vitaminaD, como por ejemplo:  Productos lcteos, cereales o jugos fortificados. El trmino fortificado significa que se ha aadido vitaminaD al Molson Coors Brewingalimento. Revise la etiqueta del paquete para estar seguro.  Pescados grasos, como el salmn o la Wrightsborotrucha.  Huevos.  Ostras.  No utilice camas solares.  Mantenga un peso saludable. Baje de peso, si es necesario.  Concurra a todas las visitas de control como se lo haya indicado el mdico. Esto es importante. SOLICITE ATENCIN MDICA SI:  Los sntomas no desaparecen.  Tiene malestar estomacal (nuseas) o vomita.  Defeca menos que lo habitual o le resulta difcil defecar (estreimiento). Esta informacin no tiene Theme park managercomo fin reemplazar el consejo del mdico. Asegrese de hacerle al mdico cualquier pregunta que tenga. Document Released: 12/29/2011 Document Revised: 06/27/2015 Document Reviewed: 02/21/2015 Elsevier Interactive Patient Education  2017 ArvinMeritorElsevier Inc.

## 2016-12-15 NOTE — Addendum Note (Signed)
Addended by: Kem ParkinsonBARNES, Delight Bickle on: 12/15/2016 12:03 PM   Modules accepted: Orders

## 2016-12-16 LAB — URINALYSIS W MICROSCOPIC + REFLEX CULTURE
BACTERIA UA: NONE SEEN [HPF]
BILIRUBIN URINE: NEGATIVE
Casts: NONE SEEN [LPF]
Crystals: NONE SEEN [HPF]
GLUCOSE, UA: NEGATIVE
Hgb urine dipstick: NEGATIVE
Ketones, ur: NEGATIVE
Leukocytes, UA: NEGATIVE
Nitrite: NEGATIVE
PH: 5.5 (ref 5.0–8.0)
PROTEIN: NEGATIVE
RBC / HPF: NONE SEEN RBC/HPF (ref <=2)
Specific Gravity, Urine: 1.022 (ref 1.001–1.035)
Yeast: NONE SEEN [HPF]

## 2016-12-17 ENCOUNTER — Other Ambulatory Visit: Payer: Self-pay

## 2016-12-17 LAB — URINE CULTURE

## 2016-12-17 MED ORDER — NITROFURANTOIN MONOHYD MACRO 100 MG PO CAPS: 100.0000 mg | ORAL_CAPSULE | Freq: Two times a day (BID) | ORAL | 0 refills | Status: DC

## 2016-12-18 ENCOUNTER — Other Ambulatory Visit: Payer: Self-pay

## 2016-12-18 DIAGNOSIS — E559 Vitamin D deficiency, unspecified: Secondary | ICD-10-CM

## 2016-12-19 LAB — VITAMIN D 25 HYDROXY (VIT D DEFICIENCY, FRACTURES): Vit D, 25-Hydroxy: 30 ng/mL (ref 30–100)

## 2017-03-04 ENCOUNTER — Encounter: Payer: Self-pay | Admitting: Gynecology

## 2017-12-16 ENCOUNTER — Encounter: Payer: Self-pay | Admitting: Obstetrics & Gynecology

## 2017-12-21 ENCOUNTER — Ambulatory Visit (INDEPENDENT_AMBULATORY_CARE_PROVIDER_SITE_OTHER): Payer: Self-pay | Admitting: Obstetrics & Gynecology

## 2017-12-21 ENCOUNTER — Encounter: Payer: Self-pay | Admitting: Obstetrics & Gynecology

## 2017-12-21 VITALS — BP 110/72 | Ht 65.0 in | Wt 142.0 lb

## 2017-12-21 DIAGNOSIS — Z30431 Encounter for routine checking of intrauterine contraceptive device: Secondary | ICD-10-CM

## 2017-12-21 DIAGNOSIS — Z1151 Encounter for screening for human papillomavirus (HPV): Secondary | ICD-10-CM

## 2017-12-21 DIAGNOSIS — Z01419 Encounter for gynecological examination (general) (routine) without abnormal findings: Secondary | ICD-10-CM

## 2017-12-21 DIAGNOSIS — Z8042 Family history of malignant neoplasm of prostate: Secondary | ICD-10-CM

## 2017-12-21 NOTE — Progress Notes (Signed)
[place] 02-23-1976 [id number]   History:    42 y.o. G1P1L1 remarried.  1st husband died at 45 yo from a Heart Attack.  Son is 84 yo.  RP:  Established patient presenting for annual gyn exam   HPI: Well on Mirena IUD x 08/2013, with light menses every month.  No pelvic pain.  Secretions normal.  No pain with IC.  Urine/BMs wnl.  Breasts wnl.  Has silicone implants.  Will do health labs here today.  BMI 23.63.  Aerobics and weight lifting regularly.  Father with metastatic Prostate Ca (lives in [place], [place]).  No genetic screening known to patient.  Past medical history,surgical history, family history and social history were all reviewed and documented in the EPIC chart.  Gynecologic History Patient's last menstrual period was 12/04/2017. Contraception: Mirena IUD x 08/2013 Last Pap: 11/2015. Results were: Negative Last mammogram: 12/2015. Results were: Left Dx Mammo Negative, re itching with silicone implant.   Bone Density: Never Colonoscopy: Never  Obstetric History OB History  Gravida Para Term Preterm AB Living  _0 SAB TAB Ectopic Multiple Live Births               # Outcome Date GA Lbr Len/2nd Weight Sex Delivery Anes PTL Lv  1 Para                ROS: A ROS was performed and pertinent positives and negatives are included in the history.  GENERAL: No fevers or chills. HEENT: No change in vision, no earache, sore throat or sinus congestion. NECK: No pain or stiffness. CARDIOVASCULAR: No chest pain or pressure. No palpitations. PULMONARY: No shortness of breath, cough or wheeze. GASTROINTESTINAL: No abdominal pain, nausea, vomiting or diarrhea, melena or bright red blood per rectum. GENITOURINARY: No urinary frequency, urgency, hesitancy or dysuria. MUSCULOSKELETAL: No joint or muscle pain, no back pain, no recent trauma. DERMATOLOGIC: No rash, no itching, no lesions. ENDOCRINE: No polyuria, polydipsia, no heat or cold intolerance. No recent change in weight.  HEMATOLOGICAL: No anemia or easy bruising or bleeding. NEUROLOGIC: No headache, seizures, numbness, tingling or weakness. PSYCHIATRIC: No depression, no loss of interest in normal activity or change in sleep pattern.     Exam:   BP 110/72   Ht _1  (1.651 m)   Wt 142 lb (64.4 kg)   LMP 12/04/2017 Comment: MIRENA  BMI 23.63 kg/m   Body mass index is 23.63 kg/m.  General appearance : Well developed well nourished female. No acute distress HEENT: Eyes: no retinal hemorrhage or exudates,  Neck supple, trachea midline, no carotid bruits, no thyroidmegaly Lungs: Clear to auscultation, no rhonchi or wheezes, or rib retractions  Heart: Regular rate and rhythm, no murmurs or gallops Breast:Bilateral Silicone implants.  Examined in sitting and supine position were symmetrical in appearance, no palpable masses or tenderness,  no skin retraction, no nipple inversion, no nipple discharge, no skin discoloration, no axillary or supraclavicular lymphadenopathy Abdomen: no palpable masses or tenderness, no rebound or guarding Extremities: no edema or skin discoloration or tenderness  Pelvic: Vulva: Normal             Vagina: No gross lesions or discharge  Cervix: No gross lesions or discharge.  IUD strings seen.  Pap/HR HPV done.  Uterus  AV, normal size, shape and consistency, non-tender and mobile  Adnexa  Without masses or tenderness  Anus: Normal   Assessment/Plan:  42 y.o. female for annual  exam   1. Encounter for routine gynecological examination with Papanicolaou smear of cervix Normal gynecologic exam.  Pap test with high risk HPV done today.  Breast exam normal with bilateral implants.  Health labs done here today. - CBC - Comp Met (CMET) - Lipid panel - VITAMIN D 25 Hydroxy (Vit-D Deficiency, Fractures) - TSH  2. Encounter for routine checking of intrauterine contraceptive device (IUD) Mirena IUD in good position and well tolerated.  Will change to a new IUD in November  2019.  3. Family history of prostate cancer in father No genetic testing done per patient.  Will follow up for pelvic ultrasound to screen for ovarian cancer. - US Transvaginal Non-OB; Future  Other orders - Iron-Vitamin C (IRON 100/C PO); Take by mouth. - Biotin 1 MG CAPS; Take by mouth. - Multiple Vitamin (MULTIVITAMIN) tablet; Take 1 tablet by mouth daily.  Princess Bruins MD, 10:20 AM 12/21/2017

## 2017-12-21 NOTE — Addendum Note (Signed)
Addended by: Berna SpareASTILLO, Ramia Sidney A on: 12/21/2017 10:42 AM   Modules accepted: Orders

## 2017-12-21 NOTE — Patient Instructions (Signed)
1. Encounter for routine gynecological examination with Papanicolaou smear of cervix Normal gynecologic exam.  Pap test with high risk HPV done today.  Breast exam normal with bilateral implants.  Health labs done here today. - CBC - Comp Met (CMET) - Lipid panel - VITAMIN D 25 Hydroxy (Vit-D Deficiency, Fractures) - TSH  2. Encounter for routine checking of intrauterine contraceptive device (IUD) Mirena IUD in good position and well tolerated.  Will change to a new IUD in November 2019.  3. Family history of prostate cancer in father No genetic testing done per patient.  Will follow up for pelvic ultrasound to screen for ovarian cancer. - US Transvaginal Non-OB; Future  Other orders - Iron-Vitamin C (IRON 100/C PO); Take by mouth. - Biotin 1 MG CAPS; Take by mouth. - Multiple Vitamin (MULTIVITAMIN) tablet; Take 1 tablet by mouth daily.  Tina Garza, it was a pleasure meeting you today!  I will inform you of all your results as soon as they are available.  Health Maintenance, Female Adopting a healthy lifestyle and getting preventive care can go a long way to promote health and wellness. Talk with your health care provider about what schedule of regular examinations is right for you. This is a good chance for you to check in with your provider about disease prevention and staying healthy. In between checkups, there are plenty of things you can do on your own. Experts have done a lot of research about which lifestyle changes and preventive measures are most likely to keep you healthy. Ask your health care provider for more information. Weight and diet Eat a healthy diet  Be sure to include plenty of vegetables, fruits, low-fat dairy products, and lean protein.  Do not eat a lot of foods high in solid fats, added sugars, or salt.  Get regular exercise. This is one of the most important things you can do for your health. ? Most adults should exercise for at least 150 minutes each week. The  exercise should increase your heart rate and make you sweat (moderate-intensity exercise). ? Most adults should also do strengthening exercises at least twice a week. This is in addition to the moderate-intensity exercise.  Maintain a healthy weight  Body mass index (BMI) is a measurement that can be used to identify possible weight problems. It estimates body fat based on height and weight. Your health care provider can help determine your BMI and help you achieve or maintain a healthy weight.  For females 51 years of age and older: ? A BMI below 18.5 is considered underweight. ? A BMI of 18.5 to 24.9 is normal. ? A BMI of 25 to 29.9 is considered overweight. ? A BMI of 30 and above is considered obese.  Watch levels of cholesterol and blood lipids  You should start having your blood tested for lipids and cholesterol at 40 years of age, then have this test every 5 years.  You may need to have your cholesterol levels checked more often if: ? Your lipid or cholesterol levels are high. ? You are older than 40 years of age. ? You are at high risk for heart disease.  Cancer screening Lung Cancer  Lung cancer screening is recommended for adults 63-80 years old who are at high risk for lung cancer because of a history of smoking.  A yearly low-dose CT scan of the lungs is recommended for people who: ? Currently smoke. ? Have quit within the past 15 years. ? Have at least a  30-pack-year history of smoking. A pack year is smoking an average of one pack of cigarettes a day for 1 year.  Yearly screening should continue until it has been 15 years since you quit.  Yearly screening should stop if you develop a health problem that would prevent you from having lung cancer treatment.  Breast Cancer  Practice breast self-awareness. This means understanding how your breasts normally appear and feel.  It also means doing regular breast self-exams. Let your health care provider know about any  changes, no matter how small.  If you are in your 20s or 30s, you should have a clinical breast exam (CBE) by a health care provider every 1-3 years as part of a regular health exam.  If you are 58 or older, have a CBE every year. Also consider having a breast X-ray (mammogram) every year.  If you have a family history of breast cancer, talk to your health care provider about genetic screening.  If you are at high risk for breast cancer, talk to your health care provider about having an MRI and a mammogram every year.  Breast cancer gene (BRCA) assessment is recommended for women who have family members with BRCA-related cancers. BRCA-related cancers include: ? Breast. ? Ovarian. ? Tubal. ? Peritoneal cancers.  Results of the assessment will determine the need for genetic counseling and BRCA1 and BRCA2 testing.  Cervical Cancer Your health care provider may recommend that you be screened regularly for cancer of the pelvic organs (ovaries, uterus, and vagina). This screening involves a pelvic examination, including checking for microscopic changes to the surface of your cervix (Pap test). You may be encouraged to have this screening done every 3 years, beginning at age 77.  For women ages 89-65, health care providers may recommend pelvic exams and Pap testing every 3 years, or they may recommend the Pap and pelvic exam, combined with testing for human papilloma virus (HPV), every 5 years. Some types of HPV increase your risk of cervical cancer. Testing for HPV may also be done on women of any age with unclear Pap test results.  Other health care providers may not recommend any screening for nonpregnant women who are considered low risk for pelvic cancer and who do not have symptoms. Ask your health care provider if a screening pelvic exam is right for you.  If you have had past treatment for cervical cancer or a condition that could lead to cancer, you need Pap tests and screening for cancer  for at least 20 years after your treatment. If Pap tests have been discontinued, your risk factors (such as having a new sexual partner) need to be reassessed to determine if screening should resume. Some women have medical problems that increase the chance of getting cervical cancer. In these cases, your health care provider may recommend more frequent screening and Pap tests.  Colorectal Cancer  This type of cancer can be detected and often prevented.  Routine colorectal cancer screening usually begins at 40 years of age and continues through 40 years of age.  Your health care provider may recommend screening at an earlier age if you have risk factors for colon cancer.  Your health care provider may also recommend using home test kits to check for hidden blood in the stool.  A small camera at the end of a tube can be used to examine your colon directly (sigmoidoscopy or colonoscopy). This is done to check for the earliest forms of colorectal cancer.  Routine screening  usually begins at age 88.  Direct examination of the colon should be repeated every 5-10 years through 40 years of age. However, you may need to be screened more often if early forms of precancerous polyps or small growths are found.  Skin Cancer  Check your skin from head to toe regularly.  Tell your health care provider about any new moles or changes in moles, especially if there is a change in a mole's shape or color.  Also tell your health care provider if you have a mole that is larger than the size of a pencil eraser.  Always use sunscreen. Apply sunscreen liberally and repeatedly throughout the day.  Protect yourself by wearing long sleeves, pants, a wide-brimmed hat, and sunglasses whenever you are outside.  Heart disease, diabetes, and high blood pressure  High blood pressure causes heart disease and increases the risk of stroke. High blood pressure is more likely to develop in: ? People who have blood  pressure in the high end of the normal range (130-139/85-89 mm Hg). ? People who are overweight or obese. ? People who are African American.  If you are 36-59 years of age, have your blood pressure checked every 3-5 years. If you are 59 years of age or older, have your blood pressure checked every year. You should have your blood pressure measured twice-once when you are at a hospital or clinic, and once when you are not at a hospital or clinic. Record the average of the two measurements. To check your blood pressure when you are not at a hospital or clinic, you can use: ? An automated blood pressure machine at a pharmacy. ? A home blood pressure monitor.  If you are between 18 years and 75 years old, ask your health care provider if you should take aspirin to prevent strokes.  Have regular diabetes screenings. This involves taking a blood sample to check your fasting blood sugar level. ? If you are at a normal weight and have a low risk for diabetes, have this test once every three years after 40 years of age. ? If you are overweight and have a high risk for diabetes, consider being tested at a younger age or more often. Preventing infection Hepatitis B  If you have a higher risk for hepatitis B, you should be screened for this virus. You are considered at high risk for hepatitis B if: ? You were born in a country where hepatitis B is common. Ask your health care provider which countries are considered high risk. ? Your parents were born in a high-risk country, and you have not been immunized against hepatitis B (hepatitis B vaccine). ? You have HIV or AIDS. ? You use needles to inject street drugs. ? You live with someone who has hepatitis B. ? You have had sex with someone who has hepatitis B. ? You get hemodialysis treatment. ? You take certain medicines for conditions, including cancer, organ transplantation, and autoimmune conditions.  Hepatitis C  Blood testing is recommended  for: ? Everyone born from [birth year] through 1963. ? Anyone with known risk factors for hepatitis C.  Sexually transmitted infections (STIs)  You should be screened for sexually transmitted infections (STIs) including gonorrhea and chlamydia if: ? You are sexually active and are younger than 40 years of age. ? You are older than 40 years of age and your health care provider tells you that you are at risk for this type of infection. ? Your sexual activity has changed  since you were last screened and you are at an increased risk for chlamydia or gonorrhea. Ask your health care provider if you are at risk.  If you do not have HIV, but are at risk, it may be recommended that you take a prescription medicine daily to prevent HIV infection. This is called pre-exposure prophylaxis (PrEP). You are considered at risk if: ? You are sexually active and do not regularly use condoms or know the HIV status of your partner(s). ? You take drugs by injection. ? You are sexually active with a partner who has HIV.  Talk with your health care provider about whether you are at high risk of being infected with HIV. If you choose to begin PrEP, you should first be tested for HIV. You should then be tested every 3 months for as long as you are taking PrEP. Pregnancy  If you are premenopausal and you may become pregnant, ask your health care provider about preconception counseling.  If you may become pregnant, take 400 to 800 micrograms (mcg) of folic acid every day.  If you want to prevent pregnancy, talk to your health care provider about birth control (contraception). Osteoporosis and menopause  Osteoporosis is a disease in which the bones lose minerals and strength with aging. This can result in serious bone fractures. Your risk for osteoporosis can be identified using a bone density scan.  If you are 52 years of age or older, or if you are at risk for osteoporosis and fractures, ask your health care provider if  you should be screened.  Ask your health care provider whether you should take a calcium or vitamin D supplement to lower your risk for osteoporosis.  Menopause may have certain physical symptoms and risks.  Hormone replacement therapy may reduce some of these symptoms and risks. Talk to your health care provider about whether hormone replacement therapy is right for you. Follow these instructions at home:  Schedule regular health, dental, and eye exams.  Stay current with your immunizations.  Do not use any tobacco products including cigarettes, chewing tobacco, or electronic cigarettes.  If you are pregnant, do not drink alcohol.  If you are breastfeeding, limit how much and how often you drink alcohol.  Limit alcohol intake to no more than 1 drink per day for nonpregnant women. One drink equals 12 ounces of beer, 5 ounces of wine, or 1 ounces of hard liquor.  Do not use street drugs.  Do not share needles.  Ask your health care provider for help if you need support or information about quitting drugs.  Tell your health care provider if you often feel depressed.  Tell your health care provider if you have ever been abused or do not feel safe at home. This information is not intended to replace advice given to you by your health care provider. Make sure you discuss any questions you have with your health care provider. Document Released: 04/21/2011 Document Revised: 03/13/2016 Document Reviewed: 07/10/2015 Elsevier Interactive Patient Education  [employer].

## 2017-12-22 LAB — COMPREHENSIVE METABOLIC PANEL
AG Ratio: 1.5 (calc) (ref 1.0–2.5)
ALKALINE PHOSPHATASE (APISO): 55 U/L (ref 33–115)
ALT: 11 U/L (ref 6–29)
AST: 12 U/L (ref 10–30)
Albumin: 4.4 g/dL (ref 3.6–5.1)
BUN: 15 mg/dL (ref 7–25)
CO2: 24 mmol/L (ref 20–32)
Calcium: 9.1 mg/dL (ref 8.6–10.2)
Chloride: 107 mmol/L (ref 98–110)
Creat: 0.68 mg/dL (ref 0.50–1.10)
Globulin: 2.9 g/dL (calc) (ref 1.9–3.7)
Glucose, Bld: 91 mg/dL (ref 65–99)
Potassium: 4 mmol/L (ref 3.5–5.3)
Sodium: 140 mmol/L (ref 135–146)
Total Bilirubin: 0.7 mg/dL (ref 0.2–1.2)
Total Protein: 7.3 g/dL (ref 6.1–8.1)

## 2017-12-22 LAB — CBC
HEMATOCRIT: 37.2 % (ref 35.0–45.0)
Hemoglobin: 12.2 g/dL (ref 11.7–15.5)
MCH: 28.2 pg (ref 27.0–33.0)
MCHC: 32.8 g/dL (ref 32.0–36.0)
MCV: 86.1 fL (ref 80.0–100.0)
MPV: 10 fL (ref 7.5–12.5)
Platelets: 366 10*3/uL (ref 140–400)
RBC: 4.32 10*6/uL (ref 3.80–5.10)
RDW: 12.6 % (ref 11.0–15.0)
WBC: 6.5 10*3/uL (ref 3.8–10.8)

## 2017-12-22 LAB — LIPID PANEL
CHOLESTEROL: 157 mg/dL (ref <200)
HDL: 65 mg/dL (ref >50)
LDL Cholesterol (Calc): 75 mg/dL (calc)
Non-HDL Cholesterol (Calc): 92 mg/dL (calc) (ref <130)
Total CHOL/HDL Ratio: 2.4 (calc) (ref <5.0)
Triglycerides: 85 mg/dL (ref <150)

## 2017-12-22 LAB — TSH: TSH: 1.7 m[IU]/L

## 2017-12-22 LAB — VITAMIN D 25 HYDROXY (VIT D DEFICIENCY, FRACTURES): Vit D, 25-Hydroxy: 41 ng/mL (ref 30–100)

## 2017-12-24 LAB — PAP, TP IMAGING W/ HPV RNA, RFLX HPV TYPE 16,18/45: HPV DNA High Risk: NOT DETECTED

## 2018-01-13 ENCOUNTER — Ambulatory Visit (INDEPENDENT_AMBULATORY_CARE_PROVIDER_SITE_OTHER): Payer: Self-pay

## 2018-01-13 ENCOUNTER — Ambulatory Visit (INDEPENDENT_AMBULATORY_CARE_PROVIDER_SITE_OTHER): Payer: Self-pay | Admitting: Obstetrics & Gynecology

## 2018-01-13 ENCOUNTER — Encounter: Payer: Self-pay | Admitting: Obstetrics & Gynecology

## 2018-01-13 DIAGNOSIS — Z8042 Family history of malignant neoplasm of prostate: Secondary | ICD-10-CM

## 2018-01-13 DIAGNOSIS — Z975 Presence of (intrauterine) contraceptive device: Secondary | ICD-10-CM

## 2018-01-13 NOTE — Patient Instructions (Signed)
1. Family history of prostate cancer in father Screening pelvic ultrasound showing bilateral normal ovaries.  Pelvic ultrasound completely negative.  Patient reassured.  2. IUD (intrauterine device) in place Mirena IUD inserted in November 2014.  Well-tolerated and in good position per ultrasound today.  Patient will schedule Mirena IUD removal/insertion in November 2019.  [place], fue un placer verle hoy!

## 2018-01-13 NOTE — Progress Notes (Signed)
    Tina Garza 12/22/1975 [id number]        42 y.o.  G1P1L1 Widowed/Remarried  RP: Father with Prostate Ca for screening Pelvic US  HPI: No pelvic pain.  Mirena IUD since November 2014.  Father with metastatic prostate cancer (lives in [place]).  No genetic testing done per patient.     OB History  Gravida Para Term Preterm AB Living  1 1       1   SAB TAB Ectopic Multiple Live Births               # Outcome Date GA Lbr Len/2nd Weight Sex Delivery Anes PTL Lv  1 Para             Past medical history,surgical history, problem list, medications, allergies, family history and social history were all reviewed and documented in the EPIC chart.   Directed ROS with pertinent positives and negatives documented in the history of present illness/assessment and plan.  Exam:  There were no vitals filed for this visit. General appearance:  Normal  Pelvic ultrasound today: T/V images.  Uterus anteverted and homogeneous measuring 10.26 x 6.05 x 4.71 cm.  Endometrial lining is normal and thin at 6.3 mm.  IUD seen in normal intrauterine position.  Right and left ovaries normal.  No apparent mass in the right or left adnexa.  No free fluid in the posterior cul-de-sac.   Assessment/Plan:  42 y.o. G1P1   1. Family history of prostate cancer in father Screening pelvic ultrasound showing bilateral normal ovaries.  Pelvic ultrasound completely negative.  Patient reassured.  2. IUD (intrauterine device) in place Mirena IUD inserted in November 2014.  Well-tolerated and in good position per ultrasound today.  Patient will schedule Mirena IUD removal/insertion in November 2019.  Counseling on above issues and coordination of care more than 50% for 15 minutes.   Genia DelMarie-Lyne Jasdeep Dejarnett MD, 9:33 AM 01/13/2018

## 2018-08-31 ENCOUNTER — Encounter: Payer: Self-pay | Admitting: Anesthesiology

## 2018-08-31 ENCOUNTER — Ambulatory Visit (INDEPENDENT_AMBULATORY_CARE_PROVIDER_SITE_OTHER): Payer: Self-pay | Admitting: Obstetrics & Gynecology

## 2018-08-31 ENCOUNTER — Encounter: Payer: Self-pay | Admitting: Obstetrics & Gynecology

## 2018-08-31 VITALS — BP 126/74

## 2018-08-31 DIAGNOSIS — Z30433 Encounter for removal and reinsertion of intrauterine contraceptive device: Secondary | ICD-10-CM

## 2018-08-31 NOTE — Progress Notes (Signed)
    Tina Garza Dec 26, 1975 [id number]        42 y.o.  G1P1L1   RP: Removal/Insertion of Mirena IUD  HPI: Doing well on Mirena IUD x 5 years.  Time to change IUD.  No abnormal vaginal bleeding.  No pelvic pain.   OB History  Gravida Para Term Preterm AB Living  1 1       1   SAB TAB Ectopic Multiple Live Births               # Outcome Date GA Lbr Len/2nd Weight Sex Delivery Anes PTL Lv  1 Para             Past medical history,surgical history, problem list, medications, allergies, family history and social history were all reviewed and documented in the EPIC chart.   Directed ROS with pertinent positives and negatives documented in the history of present illness/assessment and plan.  Exam:  Vitals:   08/31/18 0850  BP: 126/74   General appearance:  Normal                                                                    IUD procedure note       Patient presented to the office today for removal and placement of Mirena IUD. The patient had previously been provided with literature information on this method of contraception. The risks benefits and pros and cons were discussed and all her questions were answered. She is fully aware that this form of contraception is 99% effective and is good for 5 years.  Pelvic exam: Vulva normal Vagina: No lesions or discharge Cervix: No lesions or discharge.  IUD strings visible.  Strings grasped with a Boseman clamp.  Easy removal of the IUD which was intact and complete.  Shown to patient and discarded. Uterus: AV position Adnexa: No masses or tenderness Rectal exam: Not done  The cervix was cleansed with Betadine solution. Hurricane spray on the cervix.  A single-tooth tenaculum was placed on the anterior cervical lip. The IUD was shown to the patient and inserted in a sterile fashion.  Hysterometry with the IUD as being inserted was 7 cm.  The IUD string was trimmed. The single-tooth tenaculum was removed. Patient was instructed to  return back to the office in one month for follow up.        Assessment/Plan:  42 y.o. G1P1   1. Encounter for IUD removal and reinsertion Time to change Mirena IUD.  Easy removal of the IUD which was intact and complete.  Insertion of new Mirena IUD well-tolerated with no complication.  Follow-up in 4 weeks for IUD check.   Genia Del MD, 9:08 AM 08/31/2018

## 2018-09-05 ENCOUNTER — Encounter: Payer: Self-pay | Admitting: Obstetrics & Gynecology

## 2018-09-05 NOTE — Patient Instructions (Signed)
1. Encounter for IUD removal and reinsertion Time to change Mirena IUD.  Easy removal of the IUD which was intact and complete.  Insertion of new Mirena IUD well-tolerated with no complication.  Follow-up in 4 weeks for IUD check.  [place], it was a pleasure seeing you today!

## 2018-09-29 ENCOUNTER — Ambulatory Visit: Payer: Self-pay | Admitting: Obstetrics & Gynecology

## 2018-10-01 ENCOUNTER — Encounter: Payer: Self-pay | Admitting: Obstetrics & Gynecology

## 2018-10-01 ENCOUNTER — Ambulatory Visit: Payer: Self-pay | Admitting: Obstetrics & Gynecology

## 2018-10-01 VITALS — BP 124/80

## 2018-10-01 DIAGNOSIS — Z30431 Encounter for routine checking of intrauterine contraceptive device: Secondary | ICD-10-CM

## 2018-10-01 NOTE — Progress Notes (Signed)
    [place] Marval RegalSarria 11/09/1975 [id number]        42 y.o.  G1P1L1 Widowed/Remarried.  Son is 7916+ yo.   RP: Mirena IUD check post insertion 08/31/2018  HPI: Well with Mirena IUD since insertion.  No pelvic pain.  No abnormal vaginal discharge.  Had a normal light.  Early December and no breakthrough bleeding.  No pain with intercourse on her part or her husband.   OB History  Gravida Para Term Preterm AB Living  1 1       1   SAB TAB Ectopic Multiple Live Births               # Outcome Date GA Lbr Len/2nd Weight Sex Delivery Anes PTL Lv  1 Para             Past medical history,surgical history, problem list, medications, allergies, family history and social history were all reviewed and documented in the EPIC chart.   Directed ROS with pertinent positives and negatives documented in the history of present illness/assessment and plan.  Exam:  Vitals:   10/01/18 0815  BP: 124/80   General appearance:  Normal  Abdomen: Normal  Gynecologic exam: Vulva normal.  Speculum: Cervix normal with no erythema and strings well seen.  Vagina normal.  Vaginal secretions normal.   Assessment/Plan:  42 y.o. G1P1   1. IUD check up Well with Mirena IUD.  IUD in good position and no sign of infection.  Patient reassured.  Will follow-up for annual gynecologic exam in March 2020.  Counseling on above issues and coordination of care more than 50% for 15 minutes.  Genia DelMarie-Lyne Neithan Day MD, 8:36 AM 10/01/2018

## 2018-10-01 NOTE — Patient Instructions (Signed)
1. IUD check up Well with Mirena IUD.  IUD in good position and no sign of infection.  Patient reassured.  Will follow-up for annual gynecologic exam in March 2020.  Tina Garza, good seeing you today!

## 2018-12-20 ENCOUNTER — Other Ambulatory Visit: Payer: Self-pay | Admitting: Obstetrics & Gynecology

## 2018-12-20 DIAGNOSIS — Z1231 Encounter for screening mammogram for malignant neoplasm of breast: Secondary | ICD-10-CM

## 2018-12-22 ENCOUNTER — Ambulatory Visit
Admission: RE | Admit: 2018-12-22 | Discharge: 2018-12-22 | Disposition: A | Discharge status: Home / Self Care | Payer: No Typology Code available for payment source | Source: Ambulatory Visit

## 2018-12-22 ENCOUNTER — Other Ambulatory Visit: Payer: Self-pay | Admitting: Obstetrics & Gynecology

## 2018-12-22 DIAGNOSIS — Z1231 Encounter for screening mammogram for malignant neoplasm of breast: Secondary | ICD-10-CM

## 2019-01-13 ENCOUNTER — Encounter: Payer: Self-pay | Admitting: Obstetrics & Gynecology

## 2019-04-01 ENCOUNTER — Encounter: Payer: Self-pay | Admitting: Obstetrics & Gynecology

## 2020-01-05 ENCOUNTER — Other Ambulatory Visit: Payer: Self-pay | Admitting: Obstetrics & Gynecology

## 2020-01-05 DIAGNOSIS — Z1231 Encounter for screening mammogram for malignant neoplasm of breast: Secondary | ICD-10-CM

## 2020-01-09 ENCOUNTER — Other Ambulatory Visit: Payer: Self-pay

## 2020-01-09 ENCOUNTER — Ambulatory Visit
Admission: RE | Admit: 2020-01-09 | Discharge: 2020-01-09 | Disposition: A | Discharge status: Home / Self Care | Payer: Self-pay | Source: Ambulatory Visit

## 2020-01-09 DIAGNOSIS — Z1231 Encounter for screening mammogram for malignant neoplasm of breast: Secondary | ICD-10-CM

## 2020-03-05 ENCOUNTER — Encounter: Payer: Self-pay | Admitting: Obstetrics & Gynecology

## 2020-03-07 ENCOUNTER — Other Ambulatory Visit: Payer: Self-pay

## 2020-03-08 ENCOUNTER — Ambulatory Visit (INDEPENDENT_AMBULATORY_CARE_PROVIDER_SITE_OTHER): Payer: Self-pay | Admitting: Obstetrics & Gynecology

## 2020-03-08 ENCOUNTER — Encounter: Payer: Self-pay | Admitting: Obstetrics & Gynecology

## 2020-03-08 VITALS — BP 142/90 | Ht 65.0 in | Wt 146.0 lb

## 2020-03-08 DIAGNOSIS — Z01419 Encounter for gynecological examination (general) (routine) without abnormal findings: Secondary | ICD-10-CM

## 2020-03-08 DIAGNOSIS — Z30431 Encounter for routine checking of intrauterine contraceptive device: Secondary | ICD-10-CM

## 2020-03-08 NOTE — Progress Notes (Signed)
  Tina Garza 07/16/1976 [id number]   History:    44 y.o. G1P1L1 remarried.  1st husband died at 37 yo from a Heart Attack.  Son is 19 yo in business at [employer].  RP:  Established patient presenting for annual gyn exam   HPI: Well on Mirena IUD x 08/2018, with light menses every month.  No pelvic pain.  Secretions normal.  No pain with IC.  Urine/BMs wnl.  Breasts wnl.  Has silicone implants.  Will do health labs here today.  BMI 24.3. Not as physically active since her father died from metastatic Prostate Ca ([place], [place]) in March 2021. No genetic screening done.  Normal grieving process.  Her mother is here visiting her.   Past medical history,surgical history, family history and social history were all reviewed and documented in the EPIC chart.  Gynecologic History Patient's last menstrual period was 02/18/2020.  Obstetric History OB History  Gravida Para Term Preterm AB Living  1 1       1  SAB TAB Ectopic Multiple Live Births               # Outcome Date GA Lbr Len/2nd Weight Sex Delivery Anes PTL Lv  1 Para              ROS: A ROS was performed and pertinent positives and negatives are included in the history.  GENERAL: No fevers or chills. HEENT: No change in vision, no earache, sore throat or sinus congestion. NECK: No pain or stiffness. CARDIOVASCULAR: No chest pain or pressure. No palpitations. PULMONARY: No shortness of breath, cough or wheeze. GASTROINTESTINAL: No abdominal pain, nausea, vomiting or diarrhea, melena or bright red blood per rectum. GENITOURINARY: No urinary frequency, urgency, hesitancy or dysuria. MUSCULOSKELETAL: No joint or muscle pain, no back pain, no recent trauma. DERMATOLOGIC: No rash, no itching, no lesions. ENDOCRINE: No polyuria, polydipsia, no heat or cold intolerance. No recent change in weight. HEMATOLOGICAL: No anemia or easy bruising or bleeding. NEUROLOGIC: No headache, seizures, numbness, tingling or weakness. PSYCHIATRIC: No  depression, no loss of interest in normal activity or change in sleep pattern.     Exam:   BP (!) 142/90   Ht 5' 5" (1.651 m)   Wt 146 lb (66.2 kg)   LMP 02/18/2020   BMI 24.30 kg/m   Body mass index is 24.3 kg/m.  General appearance : Well developed well nourished female. No acute distress HEENT: Eyes: no retinal hemorrhage or exudates,  Neck supple, trachea midline, no carotid bruits, no thyroidmegaly Lungs: Clear to auscultation, no rhonchi or wheezes, or rib retractions  Heart: Regular rate and rhythm, no murmurs or gallops Breast:Examined in sitting and supine position were symmetrical in appearance, no palpable masses or tenderness,  no skin retraction, no nipple inversion, no nipple discharge, no skin discoloration, no axillary or supraclavicular lymphadenopathy Abdomen: no palpable masses or tenderness, no rebound or guarding Extremities: no edema or skin discoloration or tenderness  Pelvic: Vulva: Normal             Vagina: No gross lesions or discharge  Cervix: No gross lesions or discharge.  IUD strings visible at the external os.  Pap reflex done.  Uterus  AV, normal size, shape and consistency, non-tender and mobile  Adnexa  Without masses or tenderness  Anus: Normal   Assessment/Plan:  44 y.o. female for annual exam   1. Encounter for routine gynecological examination with Papanicolaou smear of cervix Normal gynecologic exam.  Pap reflex   done.  Breast exam status post bilateral silicone implants.  Will schedule screening mammogram now.  Fasting health labs here today.  Good body mass index at 24.3.  Continue with healthy nutrition and increase physical activity to aerobic activities 5 times a week with light weightlifting every 2 days. - CBC - Comp Met (CMET) - TSH - Lipid panel - VITAMIN D 25 Hydroxy (Vit-D Deficiency, Fractures)  2. Encounter for routine checking of intrauterine contraceptive device (IUD) Mirena IUD well-tolerated since November 2019.  In  good position.  Princess Bruins MD, 9:06 AM 03/08/2020

## 2020-03-09 ENCOUNTER — Encounter: Payer: Self-pay | Admitting: Obstetrics & Gynecology

## 2020-03-09 LAB — CBC
HCT: 36.5 % (ref 35.0–45.0)
Hemoglobin: 11.6 g/dL — ABNORMAL LOW (ref 11.7–15.5)
MCH: 27.8 pg (ref 27.0–33.0)
MCHC: 31.8 g/dL — ABNORMAL LOW (ref 32.0–36.0)
MCV: 87.3 fL (ref 80.0–100.0)
MPV: 9.7 fL (ref 7.5–12.5)
Platelets: 381 10*3/uL (ref 140–400)
RBC: 4.18 10*6/uL (ref 3.80–5.10)
RDW: 12.6 % (ref 11.0–15.0)
WBC: 6.4 10*3/uL (ref 3.8–10.8)

## 2020-03-09 LAB — COMPREHENSIVE METABOLIC PANEL
AG Ratio: 1.6 (calc) (ref 1.0–2.5)
ALT: 9 U/L (ref 6–29)
AST: 11 U/L (ref 10–30)
Albumin: 4.4 g/dL (ref 3.6–5.1)
Alkaline phosphatase (APISO): 58 U/L (ref 31–125)
BUN: 13 mg/dL (ref 7–25)
CO2: 22 mmol/L (ref 20–32)
Calcium: 9.2 mg/dL (ref 8.6–10.2)
Chloride: 105 mmol/L (ref 98–110)
Creat: 0.66 mg/dL (ref 0.50–1.10)
Globulin: 2.8 g/dL (calc) (ref 1.9–3.7)
Glucose, Bld: 91 mg/dL (ref 65–99)
Potassium: 4 mmol/L (ref 3.5–5.3)
Sodium: 138 mmol/L (ref 135–146)
Total Bilirubin: 0.8 mg/dL (ref 0.2–1.2)
Total Protein: 7.2 g/dL (ref 6.1–8.1)

## 2020-03-09 LAB — PAP IG W/ RFLX HPV ASCU

## 2020-03-09 LAB — LIPID PANEL
Cholesterol: 171 mg/dL (ref <200)
HDL: 67 mg/dL (ref >=50)
LDL Cholesterol (Calc): 89 mg/dL (calc)
Non-HDL Cholesterol (Calc): 104 mg/dL (calc) (ref <130)
Total CHOL/HDL Ratio: 2.6 (calc) (ref <5.0)
Triglycerides: 68 mg/dL (ref <150)

## 2020-03-09 LAB — VITAMIN D 25 HYDROXY (VIT D DEFICIENCY, FRACTURES): Vit D, 25-Hydroxy: 21 ng/mL — ABNORMAL LOW (ref 30–100)

## 2020-03-09 LAB — TSH: TSH: 1.76 mIU/L

## 2020-03-09 NOTE — Patient Instructions (Signed)
1. Encounter for routine gynecological examination with Papanicolaou smear of cervix Normal gynecologic exam.  Pap reflex done.  Breast exam status post bilateral silicone implants.  Will schedule screening mammogram now.  Fasting health labs here today.  Good body mass index at 24.3.  Continue with healthy nutrition and increase physical activity to aerobic activities 5 times a week with light weightlifting every 2 days. - CBC - Comp Met (CMET) - TSH - Lipid panel - VITAMIN D 25 Hydroxy (Vit-D Deficiency, Fractures)  2. Encounter for routine checking of intrauterine contraceptive device (IUD) Mirena IUD well-tolerated since November 2019.  In good position.  [place], it was a pleasure seeing you today!  I will inform you of your results as soon as they are available.

## 2020-03-12 ENCOUNTER — Encounter: Payer: Self-pay | Admitting: *Deleted

## 2020-10-10 DIAGNOSIS — Z03818 Encounter for observation for suspected exposure to other biological agents ruled out: Secondary | ICD-10-CM | POA: Diagnosis not present

## 2020-10-10 DIAGNOSIS — Z20822 Contact with and (suspected) exposure to covid-19: Secondary | ICD-10-CM | POA: Diagnosis not present

## 2020-11-22 DIAGNOSIS — L918 Other hypertrophic disorders of the skin: Secondary | ICD-10-CM | POA: Diagnosis not present

## 2020-11-22 DIAGNOSIS — L819 Disorder of pigmentation, unspecified: Secondary | ICD-10-CM | POA: Diagnosis not present

## 2020-11-22 DIAGNOSIS — B360 Pityriasis versicolor: Secondary | ICD-10-CM | POA: Diagnosis not present

## 2020-12-26 ENCOUNTER — Other Ambulatory Visit: Payer: Self-pay | Admitting: Obstetrics & Gynecology

## 2020-12-26 DIAGNOSIS — Z1231 Encounter for screening mammogram for malignant neoplasm of breast: Secondary | ICD-10-CM

## 2021-02-11 DIAGNOSIS — Z1231 Encounter for screening mammogram for malignant neoplasm of breast: Secondary | ICD-10-CM

## 2021-03-11 ENCOUNTER — Encounter: Payer: Self-pay | Admitting: Obstetrics & Gynecology

## 2021-03-15 ENCOUNTER — Other Ambulatory Visit: Payer: Self-pay

## 2021-03-15 ENCOUNTER — Encounter: Payer: Self-pay | Admitting: Obstetrics & Gynecology

## 2021-03-15 ENCOUNTER — Ambulatory Visit (INDEPENDENT_AMBULATORY_CARE_PROVIDER_SITE_OTHER): Payer: BC Managed Care – PPO | Admitting: Obstetrics & Gynecology

## 2021-03-15 VITALS — BP 122/80 | Ht 65.0 in | Wt 150.0 lb

## 2021-03-15 DIAGNOSIS — Z975 Presence of (intrauterine) contraceptive device: Secondary | ICD-10-CM | POA: Diagnosis not present

## 2021-03-15 DIAGNOSIS — Z01419 Encounter for gynecological examination (general) (routine) without abnormal findings: Secondary | ICD-10-CM

## 2021-03-15 DIAGNOSIS — Z1329 Encounter for screening for other suspected endocrine disorder: Secondary | ICD-10-CM | POA: Diagnosis not present

## 2021-03-15 DIAGNOSIS — Z1321 Encounter for screening for nutritional disorder: Secondary | ICD-10-CM | POA: Diagnosis not present

## 2021-03-15 DIAGNOSIS — Z13220 Encounter for screening for lipoid disorders: Secondary | ICD-10-CM | POA: Diagnosis not present

## 2021-03-15 NOTE — Progress Notes (Signed)
Elderon 05-01-1976 [id number]   History:    45 y.o. G1P1L1 remarried. Vasectomy. 1st husband died at 77 yo from a Heart Attack. Son is 43 yo in business at [employer].  [id number] presenting for annual gyn exam   BLT:JQZE on Mirena IUD x 08/2018, with light menses every month. No pelvic pain. Secretions normal. No pain with IC for her, but husband had discomfort feeling the strings. Urine/BMs wnl. Breasts wnl. Has silicone implants. Screening mammo scheduled. Will do health labs here today. BMI 24.96. Needs to increase fitness activities.  Father died of Prostate Ca,no genetic screening done.    Past medical history,surgical history, family history and social history were all reviewed and documented in the EPIC chart.  Gynecologic History Patient's last menstrual period was 03/09/2021.  Obstetric History OB History  Gravida Para Term Preterm AB Living  '1 1       1  ' SAB IAB Ectopic Multiple Live Births               # Outcome Date GA Lbr Len/2nd Weight Sex Delivery Anes PTL Lv  1 Para              ROS: A ROS was performed and pertinent positives and negatives are included in the history.  GENERAL: No fevers or chills. HEENT: No change in vision, no earache, sore throat or sinus congestion. NECK: No pain or stiffness. CARDIOVASCULAR: No chest pain or pressure. No palpitations. PULMONARY: No shortness of breath, cough or wheeze. GASTROINTESTINAL: No abdominal pain, nausea, vomiting or diarrhea, melena or bright red blood per rectum. GENITOURINARY: No urinary frequency, urgency, hesitancy or dysuria. MUSCULOSKELETAL: No joint or muscle pain, no back pain, no recent trauma. DERMATOLOGIC: No rash, no itching, no lesions. ENDOCRINE: No polyuria, polydipsia, no heat or cold intolerance. No recent change in weight. HEMATOLOGICAL: No anemia or easy bruising or bleeding. NEUROLOGIC: No headache, seizures, numbness, tingling or weakness. PSYCHIATRIC: No depression, no  loss of interest in normal activity or change in sleep pattern.     Exam:   BP 122/80 (BP Location: Right Arm, Patient Position: Sitting, Cuff Size: Normal)   Ht '5\' 5"'  (1.651 m)   Wt 150 lb (68 kg)   LMP 03/09/2021   BMI 24.96 kg/m   Body mass index is 24.96 kg/m.  General appearance : Well developed well nourished female. No acute distress HEENT: Eyes: no retinal hemorrhage or exudates,  Neck supple, trachea midline, no carotid bruits, no thyroidmegaly Lungs: Clear to auscultation, no rhonchi or wheezes, or rib retractions  Heart: Regular rate and rhythm, no murmurs or gallops Breast:Examined in sitting and supine position were symmetrical in appearance, no palpable masses or tenderness,  no skin retraction, no nipple inversion, no nipple discharge, no skin discoloration, no axillary or supraclavicular lymphadenopathy Abdomen: no palpable masses or tenderness, no rebound or guarding Extremities: no edema or skin discoloration or tenderness  Pelvic: Vulva: Normal             Vagina: No gross lesions or discharge  Cervix: No gross lesions or discharge.  IUD strings trimmed.  Uterus  AV, normal size, shape and consistency, non-tender and mobile  Adnexa  Without masses or tenderness  Anus: Normal   Assessment/Plan:  45 y.o. female for annual exam   1. Well female exam with routine gynecological exam Normal gynecologic exam.  Pap test May 2021 was negative, will repeat at 2 to 3 years.  Breast exam normal.  We will schedule a  screening mammogram now.  Fasting health labs here today.  Body mass index 24.96.   Continue with fitness and healthy nutrition.  - CBC - Comp Met (CMET) - TSH - Lipid Profile - Vitamin D 1,25 dihydroxy  2. IUD (intrauterine device) in place Mirena IUD well-tolerated since November 2019.  IUD in good position.  Strings trimmed because they were felt with pain for her husband during intercourse.  Princess Bruins MD, 8:53 AM 03/15/2021

## 2021-03-19 LAB — LIPID PANEL
Cholesterol: 181 mg/dL (ref <200)
HDL: 74 mg/dL (ref >=50)
LDL Cholesterol (Calc): 90 mg/dL (calc)
Non-HDL Cholesterol (Calc): 107 mg/dL (calc) (ref <130)
Total CHOL/HDL Ratio: 2.4 (calc) (ref <5.0)
Triglycerides: 80 mg/dL (ref <150)

## 2021-03-19 LAB — COMPREHENSIVE METABOLIC PANEL
AG Ratio: 1.4 (calc) (ref 1.0–2.5)
ALT: 11 U/L (ref 6–29)
AST: 13 U/L (ref 10–30)
Albumin: 4.4 g/dL (ref 3.6–5.1)
Alkaline phosphatase (APISO): 59 U/L (ref 31–125)
BUN: 18 mg/dL (ref 7–25)
CO2: 24 mmol/L (ref 20–32)
Calcium: 9 mg/dL (ref 8.6–10.2)
Chloride: 106 mmol/L (ref 98–110)
Creat: 0.59 mg/dL (ref 0.50–1.10)
Globulin: 3.2 g/dL (calc) (ref 1.9–3.7)
Glucose, Bld: 84 mg/dL (ref 65–99)
Potassium: 3.9 mmol/L (ref 3.5–5.3)
Sodium: 141 mmol/L (ref 135–146)
Total Bilirubin: 0.5 mg/dL (ref 0.2–1.2)
Total Protein: 7.6 g/dL (ref 6.1–8.1)

## 2021-03-19 LAB — CBC
HCT: 37.3 % (ref 35.0–45.0)
Hemoglobin: 11.8 g/dL (ref 11.7–15.5)
MCH: 28.1 pg (ref 27.0–33.0)
MCHC: 31.6 g/dL — ABNORMAL LOW (ref 32.0–36.0)
MCV: 88.8 fL (ref 80.0–100.0)
MPV: 9.8 fL (ref 7.5–12.5)
Platelets: 430 10*3/uL — ABNORMAL HIGH (ref 140–400)
RBC: 4.2 10*6/uL (ref 3.80–5.10)
RDW: 12.7 % (ref 11.0–15.0)
WBC: 6.1 10*3/uL (ref 3.8–10.8)

## 2021-03-19 LAB — VITAMIN D 1,25 DIHYDROXY
Vitamin D 1, 25 (OH)2 Total: 45 pg/mL (ref 18–72)
Vitamin D2 1, 25 (OH)2: <8 pg/mL
Vitamin D3 1, 25 (OH)2: 45 pg/mL

## 2021-03-19 LAB — TSH: TSH: 2.4 mIU/L

## 2021-03-28 ENCOUNTER — Ambulatory Visit
Admission: RE | Admit: 2021-03-28 | Discharge: 2021-03-28 | Disposition: A | Discharge status: Home / Self Care | Payer: No Typology Code available for payment source | Source: Ambulatory Visit | Attending: Obstetrics & Gynecology | Admitting: Obstetrics & Gynecology

## 2021-03-28 ENCOUNTER — Other Ambulatory Visit: Payer: Self-pay

## 2021-03-28 DIAGNOSIS — Z1231 Encounter for screening mammogram for malignant neoplasm of breast: Secondary | ICD-10-CM | POA: Diagnosis not present

## 2021-09-16 ENCOUNTER — Telehealth: Payer: Self-pay | Admitting: *Deleted

## 2021-09-16 ENCOUNTER — Ambulatory Visit: Payer: BC Managed Care – PPO | Admitting: Obstetrics & Gynecology

## 2021-09-16 ENCOUNTER — Telehealth: Payer: Self-pay

## 2021-09-16 ENCOUNTER — Other Ambulatory Visit: Payer: Self-pay

## 2021-09-16 ENCOUNTER — Encounter: Payer: Self-pay | Admitting: Obstetrics & Gynecology

## 2021-09-16 VITALS — BP 134/82 | HR 96

## 2021-09-16 DIAGNOSIS — R300 Dysuria: Secondary | ICD-10-CM

## 2021-09-16 MED ORDER — SULFAMETHOXAZOLE-TRIMETHOPRIM 800-160 MG PO TABS: 1.0000 | ORAL_TABLET | Freq: Two times a day (BID) | ORAL | 0 refills | Status: DC

## 2021-09-16 MED ORDER — SULFAMETHOXAZOLE-TRIMETHOPRIM 800-160 MG PO TABS: 1.0000 | ORAL_TABLET | Freq: Two times a day (BID) | ORAL | 0 refills | Status: AC

## 2021-09-16 NOTE — Telephone Encounter (Signed)
Left detailed message on cell machine per hipaa in regards to her abnormal urinalysis results and to notify her that Dr. Seymour Bars will send abx to her pharmacy. Tg

## 2021-09-16 NOTE — Progress Notes (Signed)
    [place] Marval Regal [birth year] [id number]        45 y.o.  G1P1L1 Remarried.  Husband with Vasectomy.  RP: Dysuria since this morning  HPI: Started experiencing urgency and dysuria this morning.  Saw a little bit of blood in urine.  Took AZO this am.  Last bladder infection about 10 yrs ago.  Last sexually active 2 days ago.  On Mirena IUD x 2019.  Menses regular normal.  No pelvic pain.  No vaginal bleeding.  No vaginal d/c.  No fever.   OB History  Gravida Para Term Preterm AB Living  1 1       1   SAB IAB Ectopic Multiple Live Births               # Outcome Date GA Lbr Len/2nd Weight Sex Delivery Anes PTL Lv  1 Para             Past medical history,surgical history, problem list, medications, allergies, family history and social history were all reviewed and documented in the EPIC chart.   Directed ROS with pertinent positives and negatives documented in the history of present illness/assessment and plan.  Exam:  Vitals:   09/16/21 1021  BP: 134/82  Pulse: 96  SpO2: 97%   General appearance:  Normal  CVAT Neg Bilaterally  Gynecologic exam: Deferred  U/A:  Orange Cloudy (took AZO), WBC packed, RBC packed, Bacteria Many.  [place] U. Culture.   Assessment/Plan:  45 y.o. G1P1   1. Dysuria Started experiencing urgency and dysuria this morning.  Saw a little bit of blood in urine.  Took AZO this am.  Last bladder infection about 10 yrs ago.  Last sexually active 2 days ago.  On Mirena IUD x 2019.  Menses regular normal.  No pelvic pain.  No vaginal bleeding.  No vaginal d/c.  No fever.  U/A very abnormal, probable acute cystitis.  No allergy.  Decision to start on Bactrim DS 1 tab PO BID x 3 days.  Usage reviewed and prescription sent to pharmacy.  U. Culture pending.  Push PO water. - Urinalysis,Complete w/RFL Culture  Other orders - ferrous sulfate 325 (65 FE) MG tablet; Take by mouth. - VITAMIN D PO; Take by mouth. - Omega-3 Fatty Acids (OMEGA 3 PO); Take by mouth. -  sulfamethoxazole-trimethoprim (BACTRIM DS) 800-160 MG tablet; Take 1 tablet by mouth 2 (two) times daily for 3 days.   2020 MD, 11:16 AM 09/16/2021

## 2021-09-16 NOTE — Telephone Encounter (Signed)
Patient called stating Bactrim DS was sent to wrong pharmacy.Rx should be sent to [facility].

## 2021-09-19 LAB — CULTURE INDICATED

## 2021-09-19 LAB — URINE CULTURE
MICRO NUMBER:: 12683505
SPECIMEN QUALITY:: ADEQUATE

## 2021-09-19 LAB — URINALYSIS, COMPLETE W/RFL CULTURE: Hyaline Cast: NONE SEEN /LPF

## 2022-01-16 DIAGNOSIS — Z1231 Encounter for screening mammogram for malignant neoplasm of breast: Secondary | ICD-10-CM

## 2022-02-03 DIAGNOSIS — R002 Palpitations: Secondary | ICD-10-CM | POA: Diagnosis not present

## 2022-02-05 ENCOUNTER — Ambulatory Visit (HOSPITAL_BASED_OUTPATIENT_CLINIC_OR_DEPARTMENT_OTHER): Payer: BC Managed Care – PPO | Admitting: Family Medicine

## 2022-02-05 ENCOUNTER — Other Ambulatory Visit: Payer: Self-pay | Admitting: Obstetrics & Gynecology

## 2022-02-05 ENCOUNTER — Encounter (HOSPITAL_BASED_OUTPATIENT_CLINIC_OR_DEPARTMENT_OTHER): Payer: Self-pay | Admitting: Family Medicine

## 2022-02-05 VITALS — BP 127/63 | HR 85 | Temp 98.7°F | Ht 66.0 in | Wt 149.3 lb

## 2022-02-05 DIAGNOSIS — E559 Vitamin D deficiency, unspecified: Secondary | ICD-10-CM | POA: Diagnosis not present

## 2022-02-05 DIAGNOSIS — R002 Palpitations: Secondary | ICD-10-CM | POA: Diagnosis not present

## 2022-02-05 DIAGNOSIS — Z1231 Encounter for screening mammogram for malignant neoplasm of breast: Secondary | ICD-10-CM

## 2022-02-05 DIAGNOSIS — Z0000 Encounter for general adult medical examination without abnormal findings: Secondary | ICD-10-CM

## 2022-02-05 NOTE — Progress Notes (Signed)
? ?New Patient Office Visit ? ?Subjective   ? ?Patient ID: Tina Garza, female    DOB: Nov 14, 1975  Age: 46 y.o. MRN: [id number] ? ?CC: Establish care, palpitations ? ?HPI ?[place] Dokes presents to establish care. Has questions about some heart racing recently.  ?Last PCP - only seeing OBGYN previously. ? ?Palpitations: Has been recent issue for patient - started over the past week, has had intermittent episodes, not associated with activity, can occur at rest. ?[facility], exam was unremarkable at that time. ?No associated chest pain or headaches, hasn't felt dizzy or lightheaded ?Has some increased stress through work recently - this has gotten better though ? ?Patient is originally from [place], [place] - has been living here since 1990s. Patient works for [employer]. Outside of work, patient likes to play volleyball, ping pong, cycling. ? ?Outpatient Encounter Medications as of 02/05/2022  ?Medication Sig  ? ferrous sulfate 325 (65 FE) MG tablet Take by mouth.  ? Omega-3 Fatty Acids (OMEGA 3 PO) Take by mouth.  ? VITAMIN D PO Take by mouth.  ? ?Facility-Administered Encounter Medications as of 02/05/2022  ?Medication  ? levonorgestrel (MIRENA) 20 MCG/24HR IUD  ? ? ?Past Medical History:  ?Diagnosis Date  ? Anemia   ? SVD (spontaneous vaginal delivery)   ? x 1  ? Vitamin D deficiency   ? ? ?Past Surgical History:  ?Procedure Laterality Date  ? AUGMENTATION MAMMAPLASTY    ? BREAST SURGERY    ? LAPAROSCOPIC OVARIAN CYSTECTOMY Left 11/07/2014  ? Procedure: LAPAROSCOPIC LEFT OVARIAN CYSTECTOMY AND PELVIC WASHINGS;  Surgeon: Ok Edwards, MD;  Location: [facility];  Service: Gynecology;  Laterality: Left;  ? ? ?Family History  ?Problem Relation Age of Onset  ? Diabetes Paternal Grandmother   ? ? ?Social History  ? ?Socioeconomic History  ? Marital status: Married  ?  Spouse name: Not on file  ? Number of children: Not on file  ? Years of education: Not on file  ? Highest education  level: Not on file  ?Occupational History  ? Not on file  ?Tobacco Use  ? Smoking status: Former  ?  Packs/day: 0.25  ?  Years: 5.00  ?  Pack years: 1.25  ?  Types: Cigarettes  ?  Quit date: 08/16/1991  ?  Years since quitting: 30.4  ? Smokeless tobacco: Never  ?Vaping Use  ? Vaping Use: Never used  ?Substance and Sexual Activity  ? Alcohol use: Yes  ?  Alcohol/week: 0.0 standard drinks  ?  Comment: social   ? Drug use: No  ? Sexual activity: Yes  ?  Partners: Male  ?  Birth control/protection: I.U.D.  ?  Comment: Mirena  ?Other Topics Concern  ? Not on file  ?Social History Narrative  ? Not on file  ? ?Social Determinants of Health  ? ?Financial Resource Strain: Not on file  ?Food Insecurity: Not on file  ?Transportation Needs: Not on file  ?Physical Activity: Not on file  ?Stress: Not on file  ?Social Connections: Not on file  ?Intimate Partner Violence: Not on file  ? ? ?Objective   ? ?BP 127/63   Pulse 85   Temp 98.7 ?F (37.1 ?C)   Ht 5\' 6"  (1.676 m)   Wt 149 lb 4.8 oz (67.7 kg)   SpO2 97%   BMI 24.10 kg/m?  ? ?Physical Exam ? ?46 yo female in no acute distress ?Cardiovascular exam with regular rate and rhythm,  no murmur appreciated ?Lungs clear to auscultation bilaterally ? ?Assessment & Plan:  ? ?Problem List Items Addressed This Visit   ? ?  ? Other  ? Palpitations  ?  Possible etiologies include thyroid disease, arrhythmia, stress ?Will check labs ?Check EKG today - overall reassuring - normal P waves, QRS, no T wave inversions ?If labs normal, then can proceed with monitoring - if symptoms continue, consider evaluation with cardiology. ? ?  ?  ? ?Other Visit Diagnoses   ? ? Vitamin D deficiency    -  Primary  ? Relevant Orders  ? VITAMIN D 25 Hydroxy (Vit-D Deficiency, Fractures)  ? Wellness examination      ? Relevant Orders  ? CBC with Differential/Platelet  ? Comprehensive metabolic panel  ? Hemoglobin A1c  ? Lipid panel  ? TSH Rfx on Abnormal to Free T4  ? ?  ? ? ?Return in about 1 month (around  03/07/2022) for CPE.  We will check labs in the coming days to be completed as fasting labs ? ?Heywood Tokunaga J De Peru, MD ? ? ?

## 2022-02-05 NOTE — Assessment & Plan Note (Signed)
Possible etiologies include thyroid disease, arrhythmia, stress ?Will check labs ?Check EKG today - overall reassuring - normal P waves, QRS, no T wave inversions ?If labs normal, then can proceed with monitoring - if symptoms continue, consider evaluation with cardiology. ?

## 2022-02-06 ENCOUNTER — Other Ambulatory Visit (HOSPITAL_BASED_OUTPATIENT_CLINIC_OR_DEPARTMENT_OTHER): Payer: Self-pay | Admitting: Family Medicine

## 2022-02-06 ENCOUNTER — Ambulatory Visit (HOSPITAL_BASED_OUTPATIENT_CLINIC_OR_DEPARTMENT_OTHER): Payer: BC Managed Care – PPO

## 2022-02-06 DIAGNOSIS — Z0000 Encounter for general adult medical examination without abnormal findings: Secondary | ICD-10-CM | POA: Diagnosis not present

## 2022-02-07 LAB — COMPREHENSIVE METABOLIC PANEL
ALT: 11 IU/L (ref 0–32)
AST: 13 IU/L (ref 0–40)
Albumin/Globulin Ratio: 1.9 (ref 1.2–2.2)
Albumin: 4.6 g/dL (ref 3.8–4.8)
Alkaline Phosphatase: 64 IU/L (ref 44–121)
BUN/Creatinine Ratio: 21 (ref 9–23)
BUN: 15 mg/dL (ref 6–24)
Bilirubin Total: 0.5 mg/dL (ref 0.0–1.2)
CO2: 22 mmol/L (ref 20–29)
Calcium: 9.3 mg/dL (ref 8.7–10.2)
Chloride: 106 mmol/L (ref 96–106)
Creatinine, Ser: 0.73 mg/dL (ref 0.57–1.00)
Globulin, Total: 2.4 g/dL (ref 1.5–4.5)
Glucose: 100 mg/dL — ABNORMAL HIGH (ref 70–99)
Potassium: 4 mmol/L (ref 3.5–5.2)
Sodium: 141 mmol/L (ref 134–144)
Total Protein: 7 g/dL (ref 6.0–8.5)
eGFR: 105 mL/min/{1.73_m2} (ref >59)

## 2022-02-07 LAB — CBC WITH DIFFERENTIAL/PLATELET
Basophils Absolute: 0 10*3/uL (ref 0.0–0.2)
Basos: 1 %
EOS (ABSOLUTE): 0.1 10*3/uL (ref 0.0–0.4)
Eos: 1 %
Hematocrit: 35.8 % (ref 34.0–46.6)
Hemoglobin: 11.5 g/dL (ref 11.1–15.9)
Immature Grans (Abs): 0 10*3/uL (ref 0.0–0.1)
Immature Granulocytes: 0 %
Lymphocytes Absolute: 2.3 10*3/uL (ref 0.7–3.1)
Lymphs: 34 %
MCH: 28 pg (ref 26.6–33.0)
MCHC: 32.1 g/dL (ref 31.5–35.7)
MCV: 87 fL (ref 79–97)
Monocytes Absolute: 0.5 10*3/uL (ref 0.1–0.9)
Monocytes: 8 %
Neutrophils Absolute: 3.7 10*3/uL (ref 1.4–7.0)
Neutrophils: 56 %
Platelets: 371 10*3/uL (ref 150–450)
RBC: 4.1 x10E6/uL (ref 3.77–5.28)
RDW: 12.9 % (ref 11.7–15.4)
WBC: 6.6 10*3/uL (ref 3.4–10.8)

## 2022-02-07 LAB — VITAMIN D 25 HYDROXY (VIT D DEFICIENCY, FRACTURES): Vit D, 25-Hydroxy: 36.5 ng/mL (ref 30.0–100.0)

## 2022-02-07 LAB — TSH RFX ON ABNORMAL TO FREE T4: TSH: 2.01 u[IU]/mL (ref 0.450–4.500)

## 2022-02-07 LAB — HEMOGLOBIN A1C
Est. average glucose Bld gHb Est-mCnc: 111 mg/dL
Hgb A1c MFr Bld: 5.5 % (ref 4.8–5.6)

## 2022-03-26 ENCOUNTER — Encounter: Payer: Self-pay | Admitting: Obstetrics & Gynecology

## 2022-03-26 ENCOUNTER — Ambulatory Visit: Payer: BC Managed Care – PPO | Admitting: Obstetrics & Gynecology

## 2022-03-26 ENCOUNTER — Ambulatory Visit (INDEPENDENT_AMBULATORY_CARE_PROVIDER_SITE_OTHER): Payer: BC Managed Care – PPO | Admitting: Obstetrics & Gynecology

## 2022-03-26 VITALS — BP 116/76 | HR 81 | Resp 99 | Ht 64.5 in | Wt 148.0 lb

## 2022-03-26 DIAGNOSIS — Z01419 Encounter for gynecological examination (general) (routine) without abnormal findings: Secondary | ICD-10-CM

## 2022-03-26 DIAGNOSIS — Z9189 Other specified personal risk factors, not elsewhere classified: Secondary | ICD-10-CM | POA: Diagnosis not present

## 2022-03-26 DIAGNOSIS — Z975 Presence of (intrauterine) contraceptive device: Secondary | ICD-10-CM | POA: Diagnosis not present

## 2022-03-26 NOTE — Progress Notes (Signed)
Tina Garza 07-02-1976 [id number]   History:    46 y.o. G1P1L1 remarried. Vasectomy.  1st husband died at 4 yo from a Heart Attack.  Son is 1 yo in business at [employer].   RP:  Established patient presenting for annual gyn exam    HPI: Well on Mirena IUD x 08/2018, with light menses every month.  No pelvic pain.  Secretions normal.  No pain with IC.  Pap Neg 02/2020.  No h/o abnormal pap.  Will repeat at 3 yrs.   Urine/BMs wnl. Breasts wnl.  Has silicone implants. Screening mammo scheduled tomorrow.  BMI 25.01. Father died of Prostate Ca, no genetic screening done.  Health labs with Fam MD.    Past medical history,surgical history, family history and social history were all reviewed and documented in the EPIC chart.  Gynecologic History No LMP recorded. (Menstrual status: IUD).  Obstetric History OB History  Gravida Para Term Preterm AB Living  1 1 1     1   SAB IAB Ectopic Multiple Live Births               # Outcome Date GA Lbr Len/2nd Weight Sex Delivery Anes PTL Lv  1 Term              ROS: A ROS was performed and pertinent positives and negatives are included in the history. GENERAL: No fevers or chills. HEENT: No change in vision, no earache, sore throat or sinus congestion. NECK: No pain or stiffness. CARDIOVASCULAR: No chest pain or pressure. No palpitations. PULMONARY: No shortness of breath, cough or wheeze. GASTROINTESTINAL: No abdominal pain, nausea, vomiting or diarrhea, melena or bright red blood per rectum. GENITOURINARY: No urinary frequency, urgency, hesitancy or dysuria. MUSCULOSKELETAL: No joint or muscle pain, no back pain, no recent trauma. DERMATOLOGIC: No rash, no itching, no lesions. ENDOCRINE: No polyuria, polydipsia, no heat or cold intolerance. No recent change in weight. HEMATOLOGICAL: No anemia or easy bruising or bleeding. NEUROLOGIC: No headache, seizures, numbness, tingling or weakness. PSYCHIATRIC: No depression, no loss of interest in normal activity  or change in sleep pattern.     Exam:   BP 116/76   Pulse 81   Resp (!) 99   Ht 5' 4.5" (1.638 m)   Wt 148 lb (67.1 kg)   BMI 25.01 kg/m   Body mass index is 25.01 kg/m.  General appearance : Well developed well nourished female. No acute distress HEENT: Eyes: no retinal hemorrhage or exudates,  Neck supple, trachea midline, no carotid bruits, no thyroidmegaly Lungs: Clear to auscultation, no rhonchi or wheezes, or rib retractions  Heart: Regular rate and rhythm, no murmurs or gallops Breast:Examined in sitting and supine position were symmetrical in appearance, no palpable masses or tenderness,  no skin retraction, no nipple inversion, no nipple discharge, no skin discoloration, no axillary or supraclavicular lymphadenopathy Abdomen: no palpable masses or tenderness, no rebound or guarding Extremities: no edema or skin discoloration or tenderness  Pelvic: Vulva: Normal             Vagina: No gross lesions or discharge  Cervix: No gross lesions or discharge.  IUD strings felt at [facility].  Uterus  AV, normal size, shape and consistency, non-tender and mobile  Adnexa  Without masses or tenderness  Anus: Normal   Assessment/Plan:  46 y.o. female for annual exam   1. Well female exam with routine gynecological exam Well on Mirena IUD x 08/2018, with light menses every month.  No pelvic  pain.  Secretions normal.  No pain with IC.  Pap Neg 02/2020.  No h/o abnormal pap.  Will repeat at 3 yrs.   Urine/BMs wnl. Breasts wnl.  Has silicone implants. Screening mammo scheduled tomorrow.  BMI 25.01. Father died of Prostate Ca, no genetic screening done.  Health labs with Fam MD.   2. IUD (intrauterine device) in place Well on Mirena IUD x 08/2018, IUD in good position.  Will continue on Mirena IUD for cycle control.  3. Relies on partner vasectomy for contraception  Other orders - Biotin 10 MG CHEW - levonorgestrel (MIRENA) 20 MCG/DAY IUD; 1 each by Intrauterine route once.   Genia Del MD, 4:43 PM 03/26/2022

## 2022-03-27 DIAGNOSIS — Z1231 Encounter for screening mammogram for malignant neoplasm of breast: Secondary | ICD-10-CM

## 2022-03-30 ENCOUNTER — Encounter: Payer: Self-pay | Admitting: Obstetrics & Gynecology

## 2022-03-31 ENCOUNTER — Other Ambulatory Visit: Payer: Self-pay | Admitting: Family Medicine

## 2022-03-31 DIAGNOSIS — Z1231 Encounter for screening mammogram for malignant neoplasm of breast: Secondary | ICD-10-CM

## 2022-04-03 ENCOUNTER — Ambulatory Visit
Admission: RE | Admit: 2022-04-03 | Discharge: 2022-04-03 | Disposition: A | Discharge status: Home / Self Care | Payer: BC Managed Care – PPO | Source: Ambulatory Visit

## 2022-04-03 DIAGNOSIS — Z1231 Encounter for screening mammogram for malignant neoplasm of breast: Secondary | ICD-10-CM | POA: Diagnosis not present

## 2022-04-08 ENCOUNTER — Encounter (HOSPITAL_BASED_OUTPATIENT_CLINIC_OR_DEPARTMENT_OTHER): Payer: Self-pay | Admitting: Family Medicine

## 2022-04-08 ENCOUNTER — Ambulatory Visit (INDEPENDENT_AMBULATORY_CARE_PROVIDER_SITE_OTHER): Payer: BC Managed Care – PPO | Admitting: Family Medicine

## 2022-04-08 VITALS — BP 118/62 | HR 76 | Temp 97.7°F | Ht 64.5 in | Wt 147.1 lb

## 2022-04-08 DIAGNOSIS — Z23 Encounter for immunization: Secondary | ICD-10-CM | POA: Diagnosis not present

## 2022-04-08 DIAGNOSIS — Z0000 Encounter for general adult medical examination without abnormal findings: Secondary | ICD-10-CM

## 2022-04-08 NOTE — Patient Instructions (Signed)
  Medication Instructions:  Your physician recommends that you continue on your current medications as directed. Please refer to the Current Medication list given to you today. --If you need a refill on any your medications before your next appointment, please call your pharmacy first. If no refills are authorized on file call the office.-- Lab Work: Your physician has recommended that you have lab work today: No If you have labs (blood work) drawn today and your tests are completely normal, you will receive your results via MyChart message OR a phone call from our staff.  Please ensure you check your voicemail in the event that you authorized detailed messages to be left on a delegated number. If you have any lab test that is abnormal or we need to change your treatment, we will call you to review the results.  Referrals/Procedures/Imaging: No  Follow-Up: Your next appointment:   Your physician recommends that you schedule a follow-up appointment in 1 yr cpe with Dr. de Cuba.  You will receive a text message or e-mail with a link to a survey about your care and experience with us today! We would greatly appreciate your feedback!   Thanks for letting us be apart of your health journey!!  Primary Care and Sports Medicine   Dr. Raymond de Cuba   We encourage you to activate your patient portal called "MyChart".  Sign up information is provided on this After Visit Summary.  MyChart is used to connect with patients for Virtual Visits (Telemedicine).  Patients are able to view lab/test results, encounter notes, upcoming appointments, etc.  Non-urgent messages can be sent to your provider as well. To learn more about what you can do with MyChart, please visit --  [email or web address].    

## 2022-04-08 NOTE — Progress Notes (Signed)
Subjective:    CC: Annual Physical Exam  HPI:  Tina Garza is a 46 y.o. presenting for annual physical  I reviewed the past medical history, family history, social history, surgical history, and allergies today and no changes were needed.  Please see the problem list section below in epic for further details.  Past Medical History: Past Medical History:  Diagnosis Date   Anemia    SVD (spontaneous vaginal delivery)    x 1   Vitamin D deficiency    Past Surgical History: Past Surgical History:  Procedure Laterality Date   AUGMENTATION MAMMAPLASTY Bilateral    2008-gel   BREAST SURGERY     LAPAROSCOPIC OVARIAN CYSTECTOMY Left 11/07/2014   Procedure: LAPAROSCOPIC LEFT OVARIAN CYSTECTOMY AND PELVIC WASHINGS;  Surgeon: Ok Edwards, MD;  Location: [facility];  Service: Gynecology;  Laterality: Left;   Social History: Social History   Socioeconomic History   Marital status: Married    Spouse name: Not on file   Number of children: Not on file   Years of education: Not on file   Highest education level: Not on file  Occupational History   Not on file  Tobacco Use   Smoking status: Former    Packs/day: 0.25    Years: 5.00    Total pack years: 1.25    Types: Cigarettes    Quit date: 08/16/1991    Years since quitting: 30.6   Smokeless tobacco: Never  Vaping Use   Vaping Use: Never used  Substance and Sexual Activity   Alcohol use: Not Currently    Comment: social    Drug use: No   Sexual activity: Yes    Partners: Male    Birth control/protection: I.U.D., Other-see comments    Comment: Mirena, husband vasectomy  Other Topics Concern   Not on file  Social History Narrative   Not on file   Social Determinants of Health   Financial Resource Strain: Not on file  Food Insecurity: Not on file  Transportation Needs: Not on file  Physical Activity: Not on file  Stress: Not on file  Social Connections: Not on file   Family History: Family History  Problem  Relation Age of Onset   Hypertension Mother    Prostate cancer Father    Diabetes Paternal Grandmother    Allergies: No Known Allergies Medications: See med rec.  Review of Systems: No headache, visual changes, nausea, vomiting, diarrhea, constipation, dizziness, abdominal pain, skin rash, fevers, chills, night sweats, swollen lymph nodes, weight loss, chest pain, body aches, joint swelling, muscle aches, shortness of breath, mood changes, visual or auditory hallucinations.  Objective:    BP 118/62   Pulse 76   Temp 97.7 F (36.5 C) (Oral)   Ht 5' 4.5" (1.638 m)   Wt 147 lb 1.6 oz (66.7 kg)   SpO2 100%   BMI 24.86 kg/m   General: Well Developed, well nourished, and in no acute distress.  Neuro: Alert and oriented x3, extra-ocular muscles intact, sensation grossly intact. Cranial nerves II through XII are intact, motor, sensory, and coordinative functions are all intact. HEENT: Normocephalic, atraumatic, pupils equal round reactive to light, neck supple, no masses, no lymphadenopathy, thyroid nonpalpable. Oropharynx, nasopharynx, external ear canals are unremarkable. Skin: Warm and dry, no rashes noted.  Cardiac: Regular rate and rhythm, no murmurs rubs or gallops.  Respiratory: Clear to auscultation bilaterally. Not using accessory muscles, speaking in full sentences.  Abdominal: Soft, nontender, nondistended, positive bowel sounds, no masses, no organomegaly.  Musculoskeletal: Shoulder, elbow, wrist, hip, knee, ankle stable, and with full range of motion.  Impression and Recommendations:    Wellness examination Routine HCM labs reviewed. HCM reviewed/discussed. Anticipatory guidance regarding healthy weight, lifestyle and choices given. Recommend healthy diet.  Recommend approximately 150 minutes/week of moderate intensity exercise Recommend regular dental and vision exams Always use seatbelt/lap and shoulder restraints Recommend using smoke alarms and checking batteries at  least twice a year Recommend using sunscreen when outside Patient due for tetanus booster - administered today  Return in about 1 year (around 04/09/2023) for CPE.   ___________________________________________ Tina Fanton de [place], MD, ABFM, CAQSM Primary Care and Sports Medicine [facility]

## 2022-04-08 NOTE — Assessment & Plan Note (Signed)
Routine HCM labs reviewed. HCM reviewed/discussed. Anticipatory guidance regarding healthy weight, lifestyle and choices given. Recommend healthy diet.  Recommend approximately 150 minutes/week of moderate intensity exercise Recommend regular dental and vision exams Always use seatbelt/lap and shoulder restraints Recommend using smoke alarms and checking batteries at least twice a year Recommend using sunscreen when outside Patient due for tetanus booster - administered today

## 2022-10-24 DIAGNOSIS — L820 Inflamed seborrheic keratosis: Secondary | ICD-10-CM | POA: Diagnosis not present

## 2022-10-24 DIAGNOSIS — L821 Other seborrheic keratosis: Secondary | ICD-10-CM | POA: Diagnosis not present

## 2022-10-24 DIAGNOSIS — L578 Other skin changes due to chronic exposure to nonionizing radiation: Secondary | ICD-10-CM | POA: Diagnosis not present

## 2022-10-24 DIAGNOSIS — D225 Melanocytic nevi of trunk: Secondary | ICD-10-CM | POA: Diagnosis not present

## 2022-12-05 HISTORY — PX: BREAST IMPLANT EXCHANGE: SHX6296

## 2023-01-27 ENCOUNTER — Ambulatory Visit: Payer: BC Managed Care – PPO | Admitting: Gastroenterology

## 2023-02-09 ENCOUNTER — Encounter: Payer: Self-pay | Admitting: Internal Medicine

## 2023-02-09 ENCOUNTER — Ambulatory Visit: Payer: BC Managed Care – PPO | Admitting: Internal Medicine

## 2023-02-09 VITALS — BP 110/70 | HR 80 | Temp 98.3°F | Ht 65.0 in | Wt 136.7 lb

## 2023-02-09 DIAGNOSIS — Z0000 Encounter for general adult medical examination without abnormal findings: Secondary | ICD-10-CM | POA: Diagnosis not present

## 2023-02-09 DIAGNOSIS — Z1211 Encounter for screening for malignant neoplasm of colon: Secondary | ICD-10-CM

## 2023-02-09 DIAGNOSIS — Z975 Presence of (intrauterine) contraceptive device: Secondary | ICD-10-CM

## 2023-02-09 DIAGNOSIS — Z8639 Personal history of other endocrine, nutritional and metabolic disease: Secondary | ICD-10-CM | POA: Diagnosis not present

## 2023-02-09 LAB — TSH: TSH: 2.26 u[IU]/mL (ref 0.35–5.50)

## 2023-02-09 LAB — CBC WITH DIFFERENTIAL/PLATELET
Basophils Absolute: 0 10*3/uL (ref 0.0–0.1)
Basophils Relative: 0.5 % (ref 0.0–3.0)
Eosinophils Absolute: 0.1 10*3/uL (ref 0.0–0.7)
Eosinophils Relative: 2.1 % (ref 0.0–5.0)
HCT: 36.1 % (ref 36.0–46.0)
Hemoglobin: 11.8 g/dL — ABNORMAL LOW (ref 12.0–15.0)
Lymphocytes Relative: 30.4 % (ref 12.0–46.0)
Lymphs Abs: 2 10*3/uL (ref 0.7–4.0)
MCHC: 32.7 g/dL (ref 30.0–36.0)
MCV: 87.7 fl (ref 78.0–100.0)
Monocytes Absolute: 0.5 10*3/uL (ref 0.1–1.0)
Monocytes Relative: 8 % (ref 3.0–12.0)
Neutro Abs: 4 10*3/uL (ref 1.4–7.7)
Neutrophils Relative %: 59 % (ref 43.0–77.0)
Platelets: 380 10*3/uL (ref 150.0–400.0)
RBC: 4.11 Mil/uL (ref 3.87–5.11)
RDW: 14.2 % (ref 11.5–15.5)
WBC: 6.7 10*3/uL (ref 4.0–10.5)

## 2023-02-09 LAB — VITAMIN D 25 HYDROXY (VIT D DEFICIENCY, FRACTURES): VITD: 30.18 ng/mL (ref 30.00–100.00)

## 2023-02-09 LAB — COMPREHENSIVE METABOLIC PANEL
ALT: 16 U/L (ref 0–35)
AST: 18 U/L (ref 0–37)
Albumin: 4 g/dL (ref 3.5–5.2)
Alkaline Phosphatase: 46 U/L (ref 39–117)
BUN: 16 mg/dL (ref 6–23)
CO2: 27 mEq/L (ref 19–32)
Calcium: 8.8 mg/dL (ref 8.4–10.5)
Chloride: 105 mEq/L (ref 96–112)
Creatinine, Ser: 0.58 mg/dL (ref 0.40–1.20)
GFR: 109.64 mL/min (ref >60.00)
Glucose, Bld: 97 mg/dL (ref 70–99)
Potassium: 3.5 mEq/L (ref 3.5–5.1)
Sodium: 139 mEq/L (ref 135–145)
Total Bilirubin: 0.4 mg/dL (ref 0.2–1.2)
Total Protein: 6.9 g/dL (ref 6.0–8.3)

## 2023-02-09 LAB — LIPID PANEL
Cholesterol: 152 mg/dL (ref 0–200)
HDL: 60.9 mg/dL (ref >39.00)
LDL Cholesterol: 78 mg/dL (ref 0–99)
NonHDL: 91.48
Total CHOL/HDL Ratio: 3
Triglycerides: 67 mg/dL (ref 0.0–149.0)
VLDL: 13.4 mg/dL (ref 0.0–40.0)

## 2023-02-09 LAB — VITAMIN B12: Vitamin B-12: 1395 pg/mL — ABNORMAL HIGH (ref 211–911)

## 2023-02-09 NOTE — Addendum Note (Signed)
Addended by: Kern Reap B on: 02/09/2023 08:24 AM   Modules accepted: Orders

## 2023-02-09 NOTE — Progress Notes (Signed)
New Patient Office Visit     CC/Reason for Visit: Establish care, annual preventive exam Previous PCP: Raymond de Peru, MD Last Visit: 03/2022  HPI: Tina Garza is a 47 y.o. female who is coming in today for the above mentioned reasons. Past Medical History is significant for: Vitamin D deficiency.  She is married, she has a 50 year old son, she does not smoke, she drinks alcohol only occasionally, no known drug allergies.  Her past surgical history significant for an ovarian cyst removal in 2014.  In February she had liposuction and breast implant exchange in [place].  Her family history significant for a father who is deceased from prostate cancer and a mother who has hypertension.  She has routine eye and dental care.  She turns 47 next month and will be due for for screening colonoscopy.  She follows routinely with GYN, she had a mammogram in June 2023.   Past Medical/Surgical History: Past Medical History:  Diagnosis Date   Anemia    SVD (spontaneous vaginal delivery)    x 1   Vitamin D deficiency     Past Surgical History:  Procedure Laterality Date   AUGMENTATION MAMMAPLASTY Bilateral    2008-gel   BREAST SURGERY     COSMETIC SURGERY  12/05/2022   Breast implants, liposuction and fat transfer   LAPAROSCOPIC OVARIAN CYSTECTOMY Left 11/07/2014   Procedure: LAPAROSCOPIC LEFT OVARIAN CYSTECTOMY AND PELVIC WASHINGS;  Surgeon: Ok Edwards, MD;  Location: [facility];  Service: Gynecology;  Laterality: Left;    Social History:  reports that she quit smoking about 31 years ago. Her smoking use included cigarettes. She has a 1.25 pack-year smoking history. She has never used smokeless tobacco. She reports current alcohol use of about 2.0 standard drinks of alcohol per week. She reports that she does not use drugs.  Allergies: No Known Allergies  Family History:  Family History  Problem Relation Age of Onset   Hypertension Mother    Prostate cancer Father    Cancer  Father    Diabetes Paternal Grandmother      Current Outpatient Medications:    Biotin 10 MG CHEW, , Disp: , Rfl:    ferrous sulfate 325 (65 FE) MG tablet, Take by mouth., Disp: , Rfl:    levonorgestrel (MIRENA) 20 MCG/DAY IUD, 1 each by Intrauterine route once., Disp: , Rfl:    Omega-3 Fatty Acids (OMEGA 3 PO), Take by mouth., Disp: , Rfl:    VITAMIN D PO, Take by mouth., Disp: , Rfl:   Review of Systems:  Negative except as indicated in HPI.   Physical Exam: Vitals:   02/09/23 0801  BP: 110/70  Pulse: 80  Temp: 98.3 F (36.8 C)  TempSrc: Oral  SpO2: 99%  Weight: 136 lb 11.2 oz (62 kg)  Height:  (1.651 m)   Body mass index is 22.75 kg/m.  Physical Exam Vitals reviewed.  Constitutional:      Appearance: Normal appearance.  HENT:     Head: Normocephalic and atraumatic.  Eyes:     Conjunctiva/sclera: Conjunctivae normal.     Pupils: Pupils are equal, round, and reactive to light.  Cardiovascular:     Rate and Rhythm: Normal rate and regular rhythm.  Pulmonary:     Effort: Pulmonary effort is normal.     Breath sounds: Normal breath sounds.  Skin:    General: Skin is warm and dry.  Neurological:     General: No focal deficit present.  Mental Status: She is alert and oriented to person, place, and time.  Psychiatric:        Mood and Affect: Mood normal.        Behavior: Behavior normal.        Thought Content: Thought content normal.        Judgment: Judgment normal.       Impression and Plan:  Encounter for preventive health examination - Plan: CBC with Differential/Platelet, Comprehensive metabolic panel, Lipid panel, TSH, Vitamin B12, Vitamin B12, TSH, Lipid panel, Comprehensive metabolic panel, CBC with Differential/Platelet  IUD (intrauterine device) in place  History of vitamin D deficiency - Plan: VITAMIN D 25 Hydroxy (Vit-D Deficiency, Fractures), VITAMIN D 25 Hydroxy (Vit-D Deficiency, Fractures)  -Recommend routine eye and dental  care. -Healthy lifestyle discussed in detail. -Labs to be updated today. -Prostate cancer screening: N/A Health Maintenance  Topic Date Due   HIV Screening  Never done   Hepatitis C Screening: USPSTF Recommendation to screen - Ages 15-79 yo.  Never done   COVID-19 Vaccine (3 - Pfizer risk series) 08/11/2023*   Flu Shot  05/21/2023   Pap Smear  02/08/2026   DTaP/Tdap/Td vaccine (2 - Td or Tdap) 04/08/2032   HPV Vaccine  Aged Out  *Topic was postponed. The date shown is not the original due date.    -GI referral for colonoscopy.       Chaya Jan, MD [facility]

## 2023-03-31 ENCOUNTER — Other Ambulatory Visit (HOSPITAL_COMMUNITY)
Admission: RE | Admit: 2023-03-31 | Discharge: 2023-03-31 | Disposition: A | Discharge status: Home / Self Care | Payer: BC Managed Care – PPO | Source: Ambulatory Visit | Attending: Obstetrics & Gynecology | Admitting: Obstetrics & Gynecology

## 2023-03-31 ENCOUNTER — Encounter: Payer: Self-pay | Admitting: Obstetrics & Gynecology

## 2023-03-31 ENCOUNTER — Ambulatory Visit (INDEPENDENT_AMBULATORY_CARE_PROVIDER_SITE_OTHER): Payer: BC Managed Care – PPO | Admitting: Obstetrics & Gynecology

## 2023-03-31 VITALS — BP 118/80 | HR 68 | Resp 16 | Ht 64.75 in | Wt 133.0 lb

## 2023-03-31 DIAGNOSIS — Z9189 Other specified personal risk factors, not elsewhere classified: Secondary | ICD-10-CM

## 2023-03-31 DIAGNOSIS — Z01419 Encounter for gynecological examination (general) (routine) without abnormal findings: Secondary | ICD-10-CM | POA: Insufficient documentation

## 2023-03-31 DIAGNOSIS — Z975 Presence of (intrauterine) contraceptive device: Secondary | ICD-10-CM

## 2023-03-31 NOTE — Progress Notes (Signed)
Tina Garza [birth year] [id number]   History:    47 y.o. G1P1L1 remarried. Vasectomy.  1st husband died at 8 yo from a Heart Attack.  Son is 18 yo in business and accounting at [employer].   RP:  Established patient presenting for annual gyn exam    HPI: Well on Mirena IUD x 08/2018, with light menses every month.  No pelvic pain. Secretions normal.  No pain with IC.  Pap Neg 02/2020.  No h/o abnormal pap. Pap reflex today. Urine/BMs wnl. Breasts wnl.  Changed silicone implants in 11/2022 in [place]. Screening Mammo 03/2022, schedule now.  Colono this year.  BMI 22.3. Father died of Prostate Ca, no genetic screening done.  Health labs with Fam MD.    Past medical history,surgical history, family history and social history were all reviewed and documented in the EPIC chart.  Gynecologic History Patient's last menstrual period was 03/26/2023.  Obstetric History OB History  Gravida Para Term Preterm AB Living  1 1 1     1   SAB IAB Ectopic Multiple Live Births               # Outcome Date GA Lbr Len/2nd Weight Sex Delivery Anes PTL Lv  1 Term              ROS: A ROS was performed and pertinent positives and negatives are included in the history. GENERAL: No fevers or chills. HEENT: No change in vision, no earache, sore throat or sinus congestion. NECK: No pain or stiffness. CARDIOVASCULAR: No chest pain or pressure. No palpitations. PULMONARY: No shortness of breath, cough or wheeze. GASTROINTESTINAL: No abdominal pain, nausea, vomiting or diarrhea, melena or bright red blood per rectum. GENITOURINARY: No urinary frequency, urgency, hesitancy or dysuria. MUSCULOSKELETAL: No joint or muscle pain, no back pain, no recent trauma. DERMATOLOGIC: No rash, no itching, no lesions. ENDOCRINE: No polyuria, polydipsia, no heat or cold intolerance. No recent change in weight. HEMATOLOGICAL: No anemia or easy bruising or bleeding. NEUROLOGIC: No headache, seizures, numbness, tingling or weakness. PSYCHIATRIC: No  depression, no loss of interest in normal activity or change in sleep pattern.     Exam:   BP 118/80   Pulse 68   Resp 16   Ht 5' 4.75" (1.645 m)   Wt 133 lb (60.3 kg)   LMP 03/26/2023 Comment: mirena iud inserted 08-31-18, sexually active  BMI 22.30 kg/m   Body mass index is 22.3 kg/m.  General appearance : Well developed well nourished female. No acute distress HEENT: Eyes: no retinal hemorrhage or exudates,  Neck supple, trachea midline, no carotid bruits, no thyroidmegaly Lungs: Clear to auscultation, no rhonchi or wheezes, or rib retractions  Heart: Regular rate and rhythm, no murmurs or gallops Breast:Examined in sitting and supine position were s/p bilateral implants, symmetrical in appearance, no palpable masses or tenderness,  no skin retraction, no nipple inversion, no nipple discharge, no skin discoloration, no axillary or supraclavicular lymphadenopathy Abdomen: no palpable masses or tenderness, no rebound or guarding Extremities: no edema or skin discoloration or tenderness  Pelvic: Vulva: Normal             Vagina: No gross lesions or discharge  Cervix: No gross lesions or discharge. IUD strings visible at [facility]. Pap reflex done.  Uterus  AV, normal size, shape and consistency, non-tender and mobile  Adnexa  Without masses or tenderness  Anus: Normal   Assessment/Plan:  47 y.o. female for annual exam   1. Encounter for routine  gynecological examination with Papanicolaou smear of cervix Well on Mirena IUD x 08/2018, with light menses every month.  No pelvic pain. Secretions normal.  No pain with IC.  Pap Neg 02/2020.  No h/o abnormal pap. Pap reflex today. Urine/BMs wnl. Breasts wnl.  Changed silicone implants in 11/2022 in [place]. Screening Mammo 03/2022, schedule now.  Colono this year.  BMI 22.3. Father died of Prostate Ca, no genetic screening done.  Health labs with Fam MD.  - Cytology - PAP( [place])  2. Relies on partner vasectomy for contraception  3. IUD  (intrauterine device) in place Well on Mirena IUD x 08/2018, with light menses every month.  No pelvic pain. Secretions normal.  No pain with IC.  IUD in good position.  Other orders - Folic Acid 0.8 MG CAPS - Ascorbic Acid (VITAMIN C) 100 MG tablet - MAGNESIUM PO; Take by mouth.   Genia Del MD, 8:48 AM

## 2023-04-02 LAB — CYTOLOGY - PAP: Diagnosis: NEGATIVE

## 2023-04-10 ENCOUNTER — Encounter (HOSPITAL_BASED_OUTPATIENT_CLINIC_OR_DEPARTMENT_OTHER): Payer: BC Managed Care – PPO | Admitting: Family Medicine

## 2023-06-02 ENCOUNTER — Other Ambulatory Visit: Payer: Self-pay | Admitting: Internal Medicine

## 2023-06-02 DIAGNOSIS — Z1231 Encounter for screening mammogram for malignant neoplasm of breast: Secondary | ICD-10-CM

## 2023-07-01 ENCOUNTER — Ambulatory Visit
Admission: RE | Admit: 2023-07-01 | Discharge: 2023-07-01 | Disposition: A | Discharge status: Home / Self Care | Payer: BC Managed Care – PPO | Source: Ambulatory Visit

## 2023-07-01 DIAGNOSIS — Z1231 Encounter for screening mammogram for malignant neoplasm of breast: Secondary | ICD-10-CM | POA: Diagnosis not present

## 2023-08-04 ENCOUNTER — Encounter: Payer: Self-pay | Admitting: Gastroenterology

## 2023-09-07 ENCOUNTER — Encounter: Payer: Self-pay | Admitting: Gastroenterology

## 2023-09-07 ENCOUNTER — Ambulatory Visit (AMBULATORY_SURGERY_CENTER): Payer: BC Managed Care – PPO | Admitting: *Deleted

## 2023-09-07 ENCOUNTER — Telehealth: Payer: Self-pay | Admitting: *Deleted

## 2023-09-07 VITALS — Ht 64.75 in | Wt 135.0 lb

## 2023-09-07 DIAGNOSIS — Z1211 Encounter for screening for malignant neoplasm of colon: Secondary | ICD-10-CM

## 2023-09-07 MED ORDER — NA SULFATE-K SULFATE-MG SULF 17.5-3.13-1.6 GM/177ML PO SOLN: 1.0000 | Freq: Once | ORAL | 0 refills | Status: AC

## 2023-09-07 NOTE — Progress Notes (Signed)
Pt's name and DOB verified at the beginning of the pre-visit wit 2 identifiers  Pt denies any difficulty with ambulating,sitting, laying down or rolling side to side  Pt has issues with ambulation   Pt has no issues moving head neck or swallowing  No egg or soy allergy known to patient   No issues known to pt with past sedation with any surgeries or procedures  Pt denies having issues being intubated  No FH of Malignant Hyperthermia  Pt is not on diet pills or shots  Pt is not on home 02   Pt is not on blood thinners   Pt denies issues with constipation   Pt is not on dialysis  Pt denise any abnormal heart rhythms   Pt denies any upcoming cardiac testing  Pt encouraged to use to use Singlecare or Goodrx to reduce cost   Patient's chart reviewed by Cathlyn Parsons CNRA prior to pre-visit and patient appropriate for the [facility].  Pre-visit completed and red dot placed by patient's name on their procedure day (on provider's schedule).  .  Visit by phone  Pt states weight is 135  lb    Instructed pt why it is important to and  to call if they have any changes in health or new medications. Directed them to the # given and on instructions.     Instructions reviewed. Pt given both [facility] main # and MD on call # prior to instructions.  Pt states understanding. Instructed to review again prior to procedure. Pt states they will.   Instructions sent by mail with coupon and by My Chart    Coupon sent via text to mobile phone and pt verified they received it

## 2023-09-07 NOTE — Telephone Encounter (Signed)
Attempt to reach pt for pre-visit. LM with call back #  Will attempt to reach again in 5 min due to no other # listed in profile  Pt called back

## 2023-09-28 ENCOUNTER — Encounter: Payer: Self-pay | Admitting: Gastroenterology

## 2023-09-28 ENCOUNTER — Ambulatory Visit (AMBULATORY_SURGERY_CENTER): Payer: BC Managed Care – PPO | Admitting: Gastroenterology

## 2023-09-28 VITALS — BP 99/51 | HR 64 | Temp 98.8°F | Resp 15 | Ht 64.75 in | Wt 135.0 lb

## 2023-09-28 DIAGNOSIS — Z1211 Encounter for screening for malignant neoplasm of colon: Secondary | ICD-10-CM | POA: Diagnosis not present

## 2023-09-28 HISTORY — PX: COLONOSCOPY WITH PROPOFOL: SHX5780

## 2023-09-28 MED ORDER — SODIUM CHLORIDE 0.9 % IV SOLN
500.0000 mL | INTRAVENOUS | Status: DC
Start: 2023-09-28 — End: 2023-09-28

## 2023-09-28 NOTE — Progress Notes (Signed)
[facility] History and Physical   Primary Care Physician:  Philip Aspen, Limmie Patricia, MD   Reason for Procedure:   Colon cancer screening  Plan:    Screening colonoscopy     HPI: Tina Garza is a 47 y.o. female undergoing initial average risk screening colonoscopy.  She has no family history of colon cancer and no chronic GI symptoms.    Past Medical History:  Diagnosis Date   Anemia    SVD (spontaneous vaginal delivery)    x 1   Vitamin D deficiency     Past Surgical History:  Procedure Laterality Date   AUGMENTATION MAMMAPLASTY Bilateral    2008-gel, replaced 11/2022   BREAST SURGERY     COSMETIC SURGERY  12/05/2022   Breast implants, liposuction and fat transfer   LAPAROSCOPIC OVARIAN CYSTECTOMY Left 11/07/2014   Procedure: LAPAROSCOPIC LEFT OVARIAN CYSTECTOMY AND PELVIC WASHINGS;  Surgeon: Ok Edwards, MD;  Location: [facility];  Service: Gynecology;  Laterality: Left;    Prior to Admission medications   Medication Sig Start Date End Date Taking? Authorizing Provider  levonorgestrel (MIRENA) 20 MCG/DAY IUD 1 each by Intrauterine route once.   Yes [provider]  Ascorbic Acid (VITAMIN C) 100 MG tablet  11/20/22   [provider]  Biotin 10 MG CHEW  11/20/21   [provider]  ferrous sulfate 325 (65 FE) MG tablet Take by mouth.    [provider]  Folic Acid 0.8 MG CAPS  11/20/22   [provider]  MAGNESIUM PO Take by mouth.    [provider]  Multiple Vitamins-Minerals (HAIR SKIN AND NAILS FORMULA PO) Take by mouth.    [provider]  Omega-3 Fatty Acids (OMEGA 3 PO) Take by mouth.    [provider]  VITAMIN D PO Take by mouth.    [provider]    Current Outpatient Medications  Medication Sig Dispense Refill   levonorgestrel (MIRENA) 20 MCG/DAY IUD 1 each by Intrauterine route once.     Ascorbic Acid (VITAMIN C) 100 MG tablet      Biotin 10 MG CHEW       ferrous sulfate 325 (65 FE) MG tablet Take by mouth.     Folic Acid 0.8 MG CAPS  (Patient not taking: Reported on 09/07/2023)     MAGNESIUM PO Take by mouth.     Multiple Vitamins-Minerals (HAIR SKIN AND NAILS FORMULA PO) Take by mouth.     Omega-3 Fatty Acids (OMEGA 3 PO) Take by mouth.     VITAMIN D PO Take by mouth.     Current Facility-Administered Medications  Medication Dose Route Frequency Provider Last Rate Last Admin   0.9 %  sodium chloride infusion  500 mL Intravenous Continuous Jenel Lucks, MD        Allergies as of 09/28/2023   (No Known Allergies)    Family History  Problem Relation Age of Onset   Hypertension Mother    Prostate cancer Father    Cancer Father    Diabetes Paternal Grandmother    Colon cancer Neg Hx    Colon polyps Neg Hx    Esophageal cancer Neg Hx    Stomach cancer Neg Hx    Rectal cancer Neg Hx     Social History   Socioeconomic History   Marital status: Married    Spouse name: Not on file   Number of children: Not on file   Years of education: Not on file  Highest education level: Not on file  Occupational History   Not on file  Tobacco Use   Smoking status: Former    Current packs/day: 0.00    Average packs/day: 0.3 packs/day for 5.0 years (1.3 ttl pk-yrs)    Types: Cigarettes    Start date: 08/15/1986    Quit date: 08/16/1991    Years since quitting: 32.1   Smokeless tobacco: Never  Vaping Use   Vaping status: Never Used  Substance and Sexual Activity   Alcohol use: Yes    Alcohol/week: 2.0 standard drinks of alcohol    Types: 2 Glasses of wine per week    Comment: Social   Drug use: No   Sexual activity: Yes    Partners: Male    Birth control/protection: I.U.D.    Comment: Mirena  Other Topics Concern   Not on file  Social History Narrative   Not on file   Social Determinants of Health   Financial Resource Strain: Not on file  Food Insecurity: Not on file  Transportation Needs: Not on file  Physical  Activity: Not on file  Stress: Not on file  Social Connections: Not on file  Intimate Partner Violence: Not on file    Review of Systems:  All other review of systems negative except as mentioned in the HPI.  Physical Exam: Vital signs BP 112/62   Pulse 90   Temp 98.8 F (37.1 C) (Temporal)   Ht 5' 4.75" (1.645 m)   Wt 135 lb (61.2 kg)   SpO2 100%   BMI 22.64 kg/m   General:   Alert,  Well-developed, well-nourished, pleasant and cooperative in NAD Airway:  Mallampati 2 Lungs:  Clear throughout to auscultation.   Heart:  Regular rate and rhythm; no murmurs, clicks, rubs,  or gallops. Abdomen:  Soft, nontender and nondistended. Normal bowel sounds.   Neuro/Psych:  Normal mood and affect. A and O x 3   Brier Firebaugh E. Tomasa Rand, MD [facility]

## 2023-09-28 NOTE — Progress Notes (Signed)
Sedate, gd SR, tolerated procedure well, VSS, report to RN 

## 2023-09-28 NOTE — Progress Notes (Signed)
Pt's states no medical or surgical changes since previsit or office visit. 

## 2023-09-28 NOTE — Patient Instructions (Signed)
Resume previous diet. Continue present medications. Repeat colonoscopy in 10 years for screening purposes.   YOU HAD AN ENDOSCOPIC PROCEDURE TODAY AT THE [facility]:   Refer to the procedure report that was given to you for any specific questions about what was found during the examination.  If the procedure report does not answer your questions, please call your gastroenterologist to clarify.  If you requested that your care partner not be given the details of your procedure findings, then the procedure report has been included in a sealed envelope for you to review at your convenience later.  YOU SHOULD EXPECT: Some feelings of bloating in the abdomen. Passage of more gas than usual.  Walking can help get rid of the air that was put into your GI tract during the procedure and reduce the bloating. If you had a lower endoscopy (such as a colonoscopy or flexible sigmoidoscopy) you may notice spotting of blood in your stool or on the toilet paper. If you underwent a bowel prep for your procedure, you may not have a normal bowel movement for a few days.  Please Note:  You might notice some irritation and congestion in your nose or some drainage.  This is from the oxygen used during your procedure.  There is no need for concern and it should clear up in a day or so.  SYMPTOMS TO REPORT IMMEDIATELY:  Following lower endoscopy (colonoscopy or flexible sigmoidoscopy):  Excessive amounts of blood in the stool  Significant tenderness or worsening of abdominal pains  Swelling of the abdomen that is new, acute  Fever of 100F or higher  For urgent or emergent issues, a gastroenterologist can be reached at any hour by calling [phone]. Do not use MyChart messaging for urgent concerns.    DIET:  We do recommend a small meal at first, but then you may proceed to your regular diet.  Drink plenty of fluids but you should avoid alcoholic beverages for 24 hours.  ACTIVITY:  You should plan  to take it easy for the rest of today and you should NOT DRIVE or use heavy machinery until tomorrow (because of the sedation medicines used during the test).    FOLLOW UP: Our staff will call the number listed on your records the next business day following your procedure.  We will call around 7:15- 8:00 am to check on you and address any questions or concerns that you may have regarding the information given to you following your procedure. If we do not reach you, we will leave a message.     If any biopsies were taken you will be contacted by phone or by letter within the next 1-3 weeks.  Please call us at [phone] if you have not heard about the biopsies in 3 weeks.    SIGNATURES/CONFIDENTIALITY: You and/or your care partner have signed paperwork which will be entered into your electronic medical record.  These signatures attest to the fact that that the information above on your After Visit Summary has been reviewed and is understood.  Full responsibility of the confidentiality of this discharge information lies with you and/or your care-partner. 

## 2023-09-28 NOTE — Op Note (Signed)
[facility] Patient Name: [place] Procedure Date: 09/28/2023 10:57 AM MRN: [id number] Endoscopist: Lorin Picket E. Tomasa Rand , MD, [id number] Age: 47 Referring MD:  Date of Birth: 04-05-1976 Gender: Female Account #: [id number] Procedure:                Colonoscopy Indications:              Screening for colorectal malignant neoplasm, This                            is the patient's first colonoscopy Medicines:                Monitored Anesthesia Care Procedure:                Pre-Anesthesia Assessment:                           - Prior to the procedure, a History and Physical                            was performed, and patient medications and                            allergies were reviewed. The patient's tolerance of                            previous anesthesia was also reviewed. The risks                            and benefits of the procedure and the sedation                            options and risks were discussed with the patient.                            All questions were answered, and informed consent                            was obtained. Prior Anticoagulants: The patient has                            taken no anticoagulant or antiplatelet agents. ASA                            Grade Assessment: I - A normal, healthy patient.                            After reviewing the risks and benefits, the patient                            was deemed in satisfactory condition to undergo the                            procedure.  After obtaining informed consent, the colonoscope                            was passed under direct vision. Throughout the                            procedure, the patient's blood pressure, pulse, and                            oxygen saturations were monitored continuously. The                            Olympus Scope SN: [id number] was introduced through                            the anus and advanced to the  the terminal ileum,                            with identification of the appendiceal orifice and                            IC valve. The colonoscopy was performed without                            difficulty. The patient tolerated the procedure                            well. The quality of the bowel preparation was                            excellent. The terminal ileum, ileocecal valve,                            appendiceal orifice, and rectum were photographed.                            The bowel preparation used was SUPREP via split                            dose instruction. Scope In: 11:06:13 AM Scope Out: 11:17:08 AM Scope Withdrawal Time: 0 hours 7 minutes 32 seconds  Total Procedure Duration: 0 hours 10 minutes 55 seconds  Findings:                 The perianal and digital rectal examinations were                            normal. Pertinent negatives include normal                            sphincter tone and no palpable rectal lesions.                           The colon (entire examined portion) appeared normal.  The terminal ileum appeared normal.                           The retroflexed view of the distal rectum and anal                            verge was normal and showed no anal or rectal                            abnormalities. Complications:            No immediate complications. Estimated Blood Loss:     Estimated blood loss: none. Impression:               - The entire examined colon is normal.                           - The examined portion of the ileum was normal.                           - The distal rectum and anal verge are normal on                            retroflexion view.                           - No specimens collected. Recommendation:           - Patient has a contact number available for                            emergencies. The signs and symptoms of potential                            delayed complications  were discussed with the                            patient. Return to normal activities tomorrow.                            Written discharge instructions were provided to the                            patient.                           - Resume previous diet.                           - Continue present medications.                           - Repeat colonoscopy in 10 years for screening                            purposes. Isidora Laham E. Tomasa Rand, MD 09/28/2023 11:22:27 AM This report has been signed electronically.

## 2023-09-29 ENCOUNTER — Telehealth: Payer: Self-pay

## 2023-09-29 NOTE — Telephone Encounter (Signed)
  Follow up Call-     09/28/2023   10:14 AM  Call back number  Post procedure Call Back phone  # [phone]  Permission to leave phone message Yes     Patient questions:  Do you have a fever, pain , or abdominal swelling? No. Pain Score  0 *  Have you tolerated food without any problems? Yes.    Have you been able to return to your normal activities? Yes.    Do you have any questions about your discharge instructions: Diet   No. Medications  No. Follow up visit  No.  Do you have questions or concerns about your Care? No.  Actions: * If pain score is 4 or above: No action needed, pain <4.

## 2023-12-13 DIAGNOSIS — J029 Acute pharyngitis, unspecified: Secondary | ICD-10-CM | POA: Diagnosis not present

## 2023-12-13 DIAGNOSIS — R059 Cough, unspecified: Secondary | ICD-10-CM | POA: Diagnosis not present

## 2023-12-13 DIAGNOSIS — Z789 Other specified health status: Secondary | ICD-10-CM | POA: Diagnosis not present

## 2023-12-13 DIAGNOSIS — J0100 Acute maxillary sinusitis, unspecified: Secondary | ICD-10-CM | POA: Diagnosis not present

## 2023-12-13 DIAGNOSIS — Z6821 Body mass index (BMI) 21.0-21.9, adult: Secondary | ICD-10-CM | POA: Diagnosis not present

## 2024-01-18 IMAGING — MG DIGITAL SCREENING BREAST BILAT IMPLANT W/ TOMO W/ CAD
9 of 12 series · 9 of 28 positions shown · non-contrast
Comparison: Previous exam(s).

CLINICAL DATA: Screening.

EXAM:
DIGITAL SCREENING BILATERAL MAMMOGRAM WITH IMPLANTS, CAD AND
TOMOSYNTHESIS
TECHNIQUE: Bilateral screening digital craniocaudal and mediolateral oblique
mammograms were obtained. Bilateral screening digital breast
tomosynthesis was performed. The images were evaluated with
computer-aided detection. Standard and/or implant displaced views
were performed.

[L MLO]
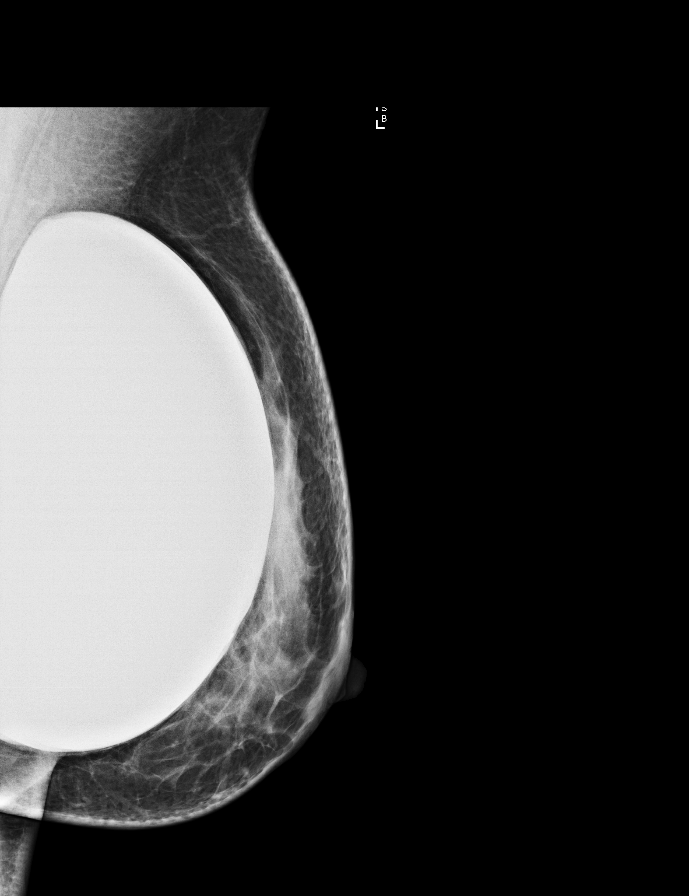

[R MLO]
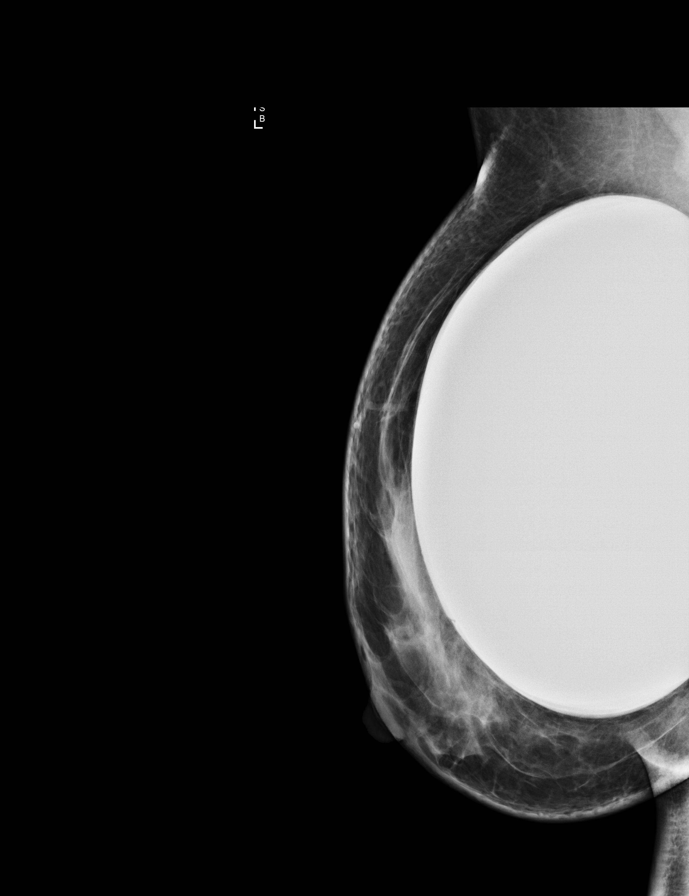

[L CC]
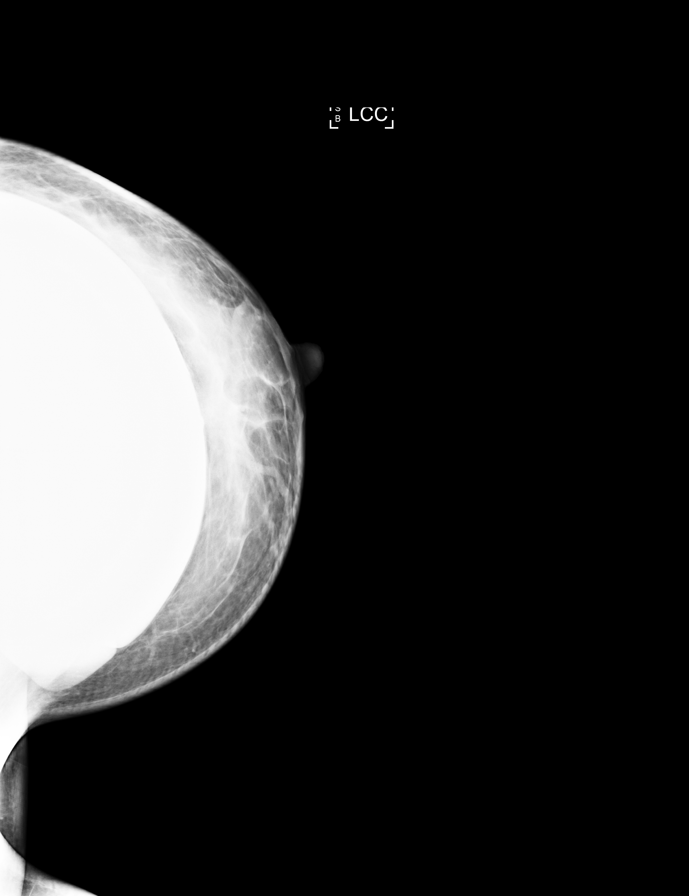

[R CC]
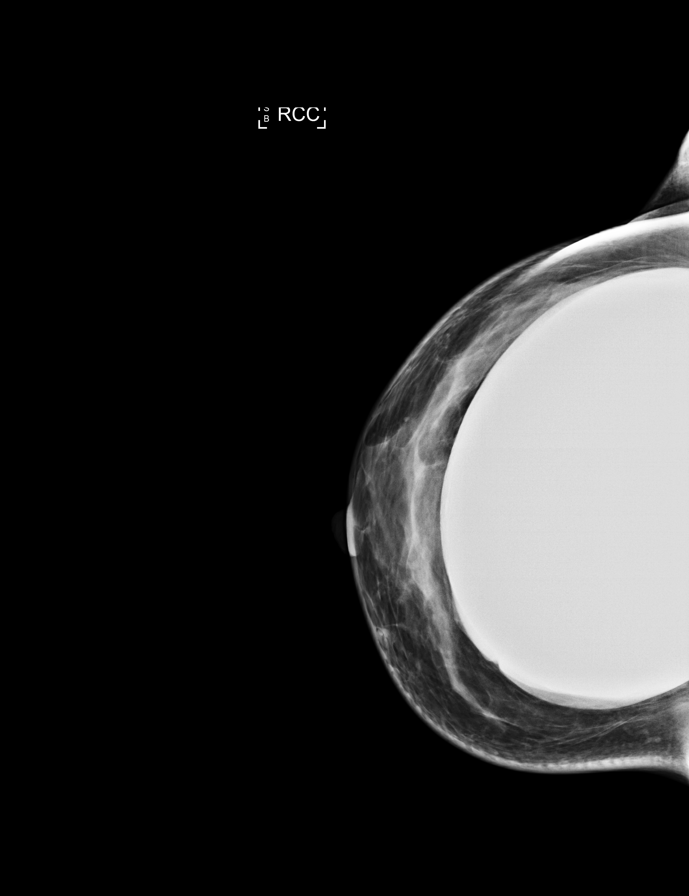

[R CC synth-2D]
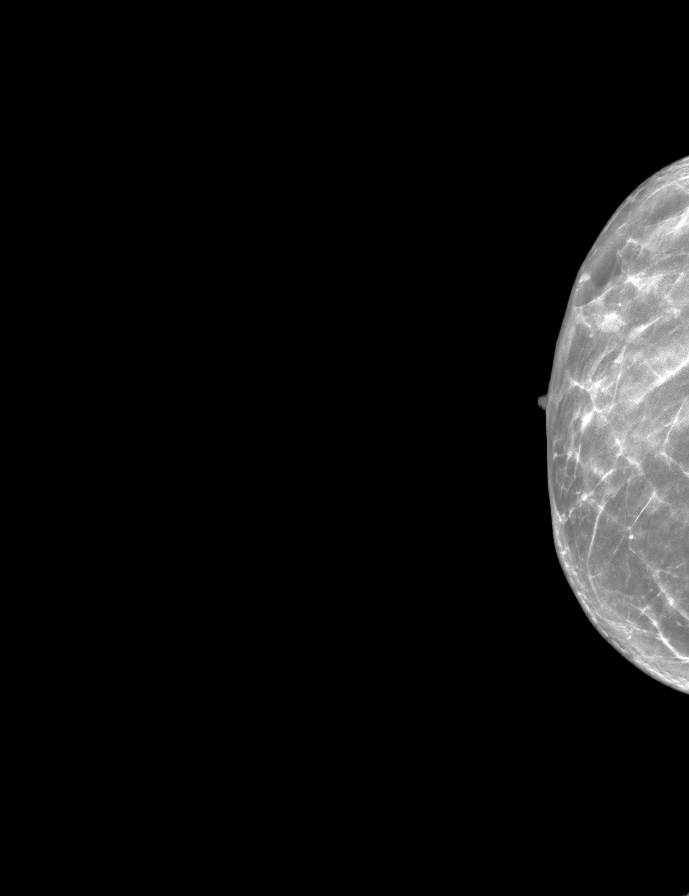

[R MLO synth-2D]
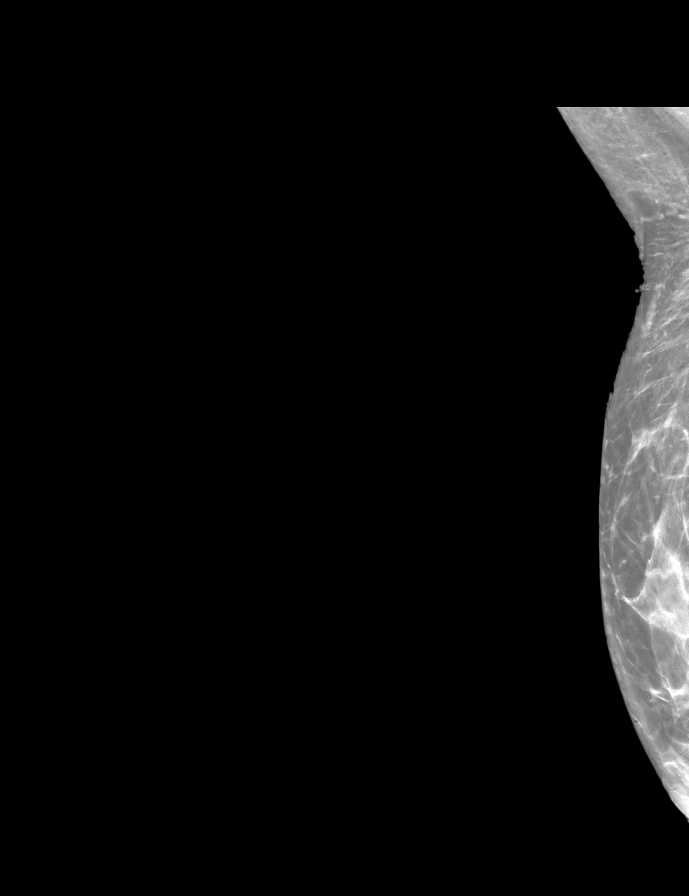

[L MLO synth-2D]
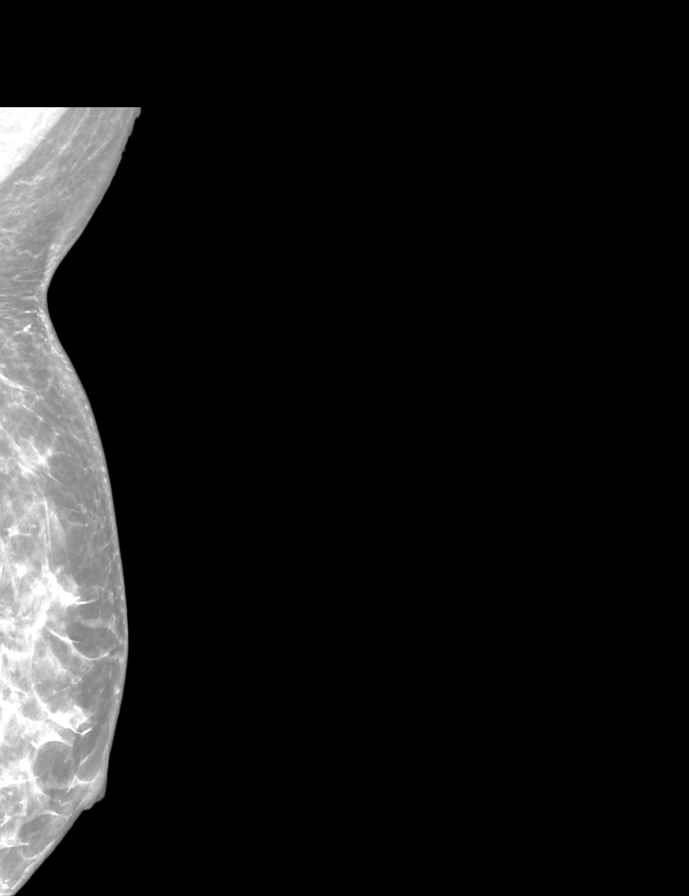

[L CC synth-2D]
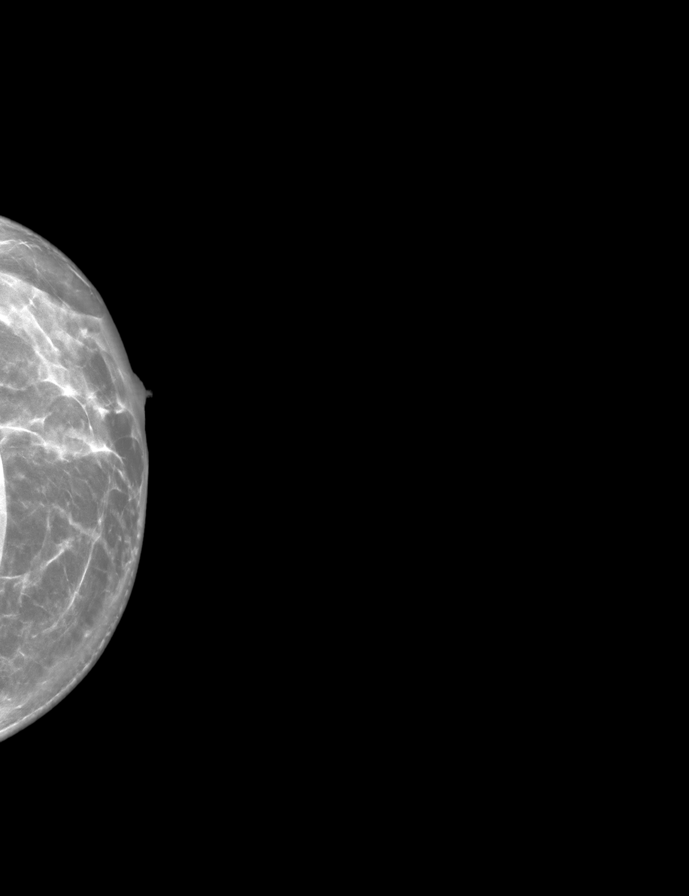

[R CCID BREAST TOMOSYNTHESIS IMAGE tomo · tomo slice 29/58.0]
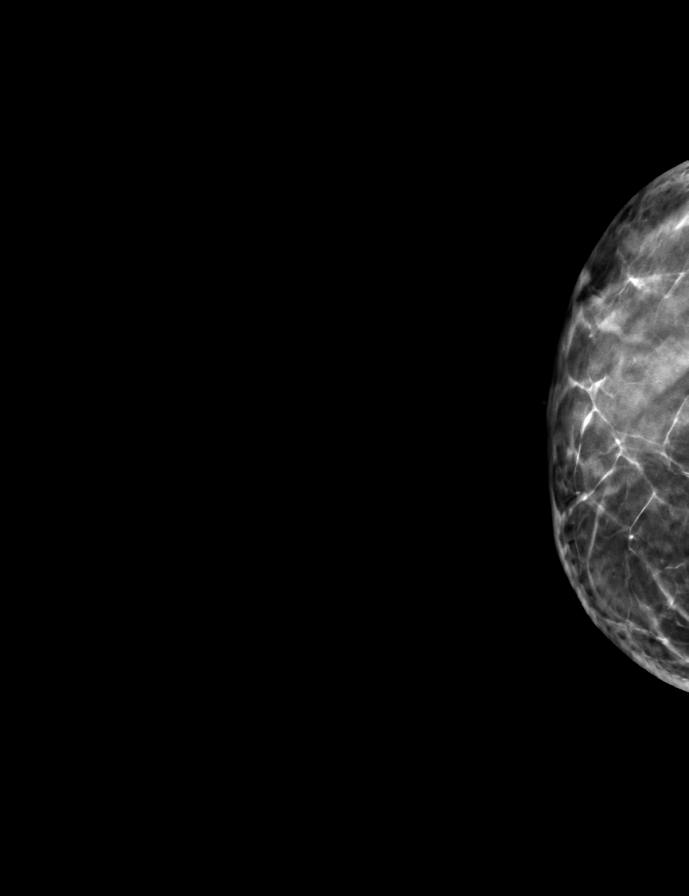

[9 of 28 positions shown; findings below may reference images not displayed]

ACR Breast Density Category c: The breast tissue is heterogeneously
dense, which may obscure small masses.
FINDINGS: The patient has retropectoral implants. There are no findings
suspicious for malignancy.
IMPRESSION: No mammographic evidence of malignancy. A result letter of this
screening mammogram will be mailed directly to the patient.

RECOMMENDATION:
Screening mammogram in one year. (Code:LT-E-7TH)

BI-RADS CATEGORY  1:  Negative.

## 2024-03-31 ENCOUNTER — Encounter: Payer: Self-pay | Admitting: Obstetrics and Gynecology

## 2024-03-31 ENCOUNTER — Ambulatory Visit (INDEPENDENT_AMBULATORY_CARE_PROVIDER_SITE_OTHER): Admitting: Obstetrics and Gynecology

## 2024-03-31 ENCOUNTER — Other Ambulatory Visit (HOSPITAL_COMMUNITY)
Admission: RE | Admit: 2024-03-31 | Discharge: 2024-03-31 | Disposition: A | Discharge status: Home / Self Care | Source: Ambulatory Visit | Attending: Obstetrics and Gynecology | Admitting: Obstetrics and Gynecology

## 2024-03-31 VITALS — BP 110/70 | HR 82 | Ht 65.75 in | Wt 135.0 lb

## 2024-03-31 DIAGNOSIS — E559 Vitamin D deficiency, unspecified: Secondary | ICD-10-CM | POA: Diagnosis not present

## 2024-03-31 DIAGNOSIS — N921 Excessive and frequent menstruation with irregular cycle: Secondary | ICD-10-CM

## 2024-03-31 DIAGNOSIS — Z01419 Encounter for gynecological examination (general) (routine) without abnormal findings: Secondary | ICD-10-CM | POA: Diagnosis not present

## 2024-03-31 DIAGNOSIS — Z1231 Encounter for screening mammogram for malignant neoplasm of breast: Secondary | ICD-10-CM

## 2024-03-31 DIAGNOSIS — N938 Other specified abnormal uterine and vaginal bleeding: Secondary | ICD-10-CM

## 2024-03-31 DIAGNOSIS — Z1331 Encounter for screening for depression: Secondary | ICD-10-CM | POA: Diagnosis not present

## 2024-03-31 DIAGNOSIS — N946 Dysmenorrhea, unspecified: Secondary | ICD-10-CM | POA: Diagnosis not present

## 2024-03-31 DIAGNOSIS — Z8742 Personal history of other diseases of the female genital tract: Secondary | ICD-10-CM

## 2024-03-31 DIAGNOSIS — N8003 Adenomyosis of the uterus: Secondary | ICD-10-CM | POA: Diagnosis not present

## 2024-03-31 DIAGNOSIS — N898 Other specified noninflammatory disorders of vagina: Secondary | ICD-10-CM | POA: Diagnosis not present

## 2024-03-31 DIAGNOSIS — R7303 Prediabetes: Secondary | ICD-10-CM | POA: Diagnosis not present

## 2024-03-31 NOTE — Progress Notes (Signed)
 47 y.o. y.o. female here for annual exam. Patient's last menstrual period was 03/03/2024 (exact date). Period Duration (Days): 7-10 Period Pattern: Regular Menstrual Flow: Light Menstrual Control: Tampon Dysmenorrhea: None  G1P1L1 remarried. Vasectomy.  1st husband died at 38 yo from a Heart Attack.  Son is 9 yo in business and accounting at [employer].   RP:  Established patient presenting for annual gyn exam    HPI: Well on Mirena  IUD x 08/2018, with bothersome heavy menses for 7-10 days twice a month.  She reports she has never had light bleeding since she was 48 y/o and is bothered by her cycles.  Did not have lighter periods at any point with the mirena . Periods are also painful. She does not desire children and would like to discuss surgical options. No pelvic pain. Secretions normal.  No pain with IC.  Pap Neg 02/2020.  No h/o abnormal pap. Pap reflex today. Urine/BMs wnl. Breasts wnl.  Changed silicone implants in 11/2022 in [place]. Screening Mammo 03/2022, schedule now.  Colono this year.  BMI 22.3. Father died of Prostate Ca, no genetic screening done.  Health labs with Fam MD.  Body mass index is 21.96 kg/m.     03/31/2024    9:23 AM 02/09/2023    8:08 AM 04/08/2022    8:13 AM  Depression screen PHQ 2/9  Decreased Interest 0 0 0  Down, Depressed, Hopeless 0 0 0  PHQ - 2 Score 0 0 0  Altered sleeping  0 0  Tired, decreased energy  0 0  Change in appetite  0 0  Feeling bad or failure about yourself   0 0  Trouble concentrating  0 0  Moving slowly or fidgety/restless  0 0  Suicidal thoughts  0 0  PHQ-9 Score  0 0  Difficult doing work/chores   Not difficult at all    Blood pressure 110/70, pulse 82, height 5' 5.75 (1.67 m), weight 135 lb (61.2 kg), last menstrual period 03/03/2024, SpO2 98%.     Component Value Date/Time   DIAGPAP  03/31/2023 0911    - Negative for intraepithelial lesion or malignancy (NILM)   ADEQPAP  03/31/2023 0911    Satisfactory but limited for  evaluation with partially obscuring blood;   ADEQPAP transformation zone component present. 03/31/2023 0911    GYN HISTORY:    Component Value Date/Time   DIAGPAP  03/31/2023 0911    - Negative for intraepithelial lesion or malignancy (NILM)   ADEQPAP  03/31/2023 0911    Satisfactory but limited for evaluation with partially obscuring blood;   ADEQPAP transformation zone component present. 03/31/2023 0911    OB History  Gravida Para Term Preterm AB Living  1 1 1   1   SAB IAB Ectopic Multiple Live Births          # Outcome Date GA Lbr Len/2nd Weight Sex Type Anes PTL Lv  1 Term             Past Medical History:  Diagnosis Date   Anemia    SVD (spontaneous vaginal delivery)    x 1   Vitamin D  deficiency     Past Surgical History:  Procedure Laterality Date   AUGMENTATION MAMMAPLASTY Bilateral    2008-gel, replaced 11/2022   BREAST SURGERY     COSMETIC SURGERY  12/05/2022   Breast implants, liposuction and fat transfer   LAPAROSCOPIC OVARIAN CYSTECTOMY Left 11/07/2014   Procedure: LAPAROSCOPIC LEFT OVARIAN CYSTECTOMY AND PELVIC WASHINGS;  Surgeon: Davia Erps, MD;  Location: [facility];  Service: Gynecology;  Laterality: Left;    Current Outpatient Medications on File Prior to Visit  Medication Sig Dispense Refill   Ascorbic Acid (VITAMIN C) 100 MG tablet      Biotin 10 MG CHEW      ferrous sulfate 325 (65 FE) MG tablet Take by mouth.     Folic Acid 0.8 MG CAPS      levonorgestrel  (MIRENA ) 20 MCG/DAY IUD 1 each by Intrauterine route once.     MAGNESIUM PO Take by mouth.     Multiple Vitamins-Minerals (HAIR SKIN AND NAILS FORMULA PO) Take by mouth.     Omega-3 Fatty Acids (OMEGA 3 PO) Take by mouth.     VITAMIN D  PO Take by mouth.     No current facility-administered medications on file prior to visit.    Social History   Socioeconomic History   Marital status: Married    Spouse name: Not on file   Number of children: Not on file   Years of education: Not on  file   Highest education level: Not on file  Occupational History   Not on file  Tobacco Use   Smoking status: Former    Current packs/day: 0.00    Average packs/day: 0.3 packs/day for 5.0 years (1.3 ttl pk-yrs)    Types: Cigarettes    Start date: 08/15/1986    Quit date: 08/16/1991    Years since quitting: 32.6   Smokeless tobacco: Never  Vaping Use   Vaping status: Never Used  Substance and Sexual Activity   Alcohol use: Yes    Alcohol/week: 2.0 standard drinks of alcohol    Types: 2 Glasses of wine per week    Comment: Social   Drug use: No   Sexual activity: Yes    Partners: Male    Birth control/protection: I.U.D.    Comment: Mirena   Other Topics Concern   Not on file  Social History Narrative   Not on file   Social Drivers of Health   Financial Resource Strain: Not on file  Food Insecurity: Not on file  Transportation Needs: Not on file  Physical Activity: Not on file  Stress: Not on file  Social Connections: Not on file  Intimate Partner Violence: Not on file    Family History  Problem Relation Age of Onset   Hypertension Mother    Prostate cancer Father    Cancer Father    Diabetes Paternal Grandmother    Colon cancer Neg Hx    Colon polyps Neg Hx    Esophageal cancer Neg Hx    Stomach cancer Neg Hx    Rectal cancer Neg Hx      No Known Allergies    Patient's last menstrual period was Patient's last menstrual period was 03/03/2024 (exact date)..             Review of Systems Alls systems reviewed and are negative.     Physical Exam Constitutional:      Appearance: Normal appearance.  Genitourinary:     Vulva and urethral meatus normal.     No lesions in the vagina.     Genitourinary Comments: IUD strings likely in endocervix. Tried to lower with cytobrush with no success.  To get PUS     Right Labia: No rash, lesions or skin changes.    Left Labia: No lesions, skin changes or rash.    No vaginal discharge or tenderness.  No  vaginal prolapse present.    No vaginal atrophy present.     Right Adnexa: not tender, not palpable and no mass present.    Left Adnexa: not tender, not palpable and no mass present.    No cervical motion tenderness or discharge.     Uterus is tender and irregular.     Uterus is not enlarged.  Breasts:    Right: Normal.     Left: Normal.  HENT:     Head: Normocephalic.  Neck:     Thyroid : No thyroid  mass, thyromegaly or thyroid  tenderness.   Cardiovascular:     Rate and Rhythm: Normal rate and regular rhythm.     Heart sounds: Normal heart sounds, S1 normal and S2 normal.  Pulmonary:     Effort: Pulmonary effort is normal.     Breath sounds: Normal breath sounds and air entry.  Abdominal:     General: There is no distension.     Palpations: Abdomen is soft. There is no mass.     Tenderness: There is no abdominal tenderness. There is no guarding or rebound.   Musculoskeletal:        General: Normal range of motion.     Cervical back: Full passive range of motion without pain, normal range of motion and neck supple. No tenderness.     Right lower leg: No edema.     Left lower leg: No edema.   Neurological:     Mental Status: She is alert.   Skin:    General: Skin is warm.   Psychiatric:        Mood and Affect: Mood normal.        Behavior: Behavior normal.        Thought Content: Thought content normal.  Vitals and nursing note reviewed. Exam conducted with a chaperone present.       A:         Well Woman GYN exam DUB Menorrhagia History of ovarian cysts Likely adenomyosis Failed hormonal management with IUD Desires surgical management                              P:        Pap smear collected today Encouraged annual mammogram screening Colon cancer screening doing soon DXA not indicated Labs and immunizations ordered today Counseled on all options.  Discussed trying other hormonal options vs. Surgical and she would like a definitive treatment with  ovarian preservation. She would like to have the [facility].  Counseled extensively on the procedure including but not limited to what to expect and risks and benefits.  Counseled on postop care and pelvic rest for 10 weeks after the surgery with restricted lifting for 6 weeks after.  Counseled on the benefits of the robotic procedure with faster return to daily activities, improved outcomes, and less risk for complications. She would like to have this scheduled.  Discussed breast self exams Encouraged healthy lifestyle practices To return for PUS.  Does not need EMB since long standing endometrial protection with the mirena  IUD before the procedure  No follow-ups on file.  Reinaldo Caras

## 2024-04-01 ENCOUNTER — Ambulatory Visit: Payer: Self-pay | Admitting: Obstetrics and Gynecology

## 2024-04-01 LAB — COMPREHENSIVE METABOLIC PANEL WITH GFR
AG Ratio: 1.5 (calc) (ref 1.0–2.5)
ALT: 11 U/L (ref 6–29)
AST: 13 U/L (ref 10–35)
Albumin: 4.4 g/dL (ref 3.6–5.1)
Alkaline phosphatase (APISO): 52 U/L (ref 31–125)
BUN: 14 mg/dL (ref 7–25)
CO2: 24 mmol/L (ref 20–32)
Calcium: 9.2 mg/dL (ref 8.6–10.2)
Chloride: 104 mmol/L (ref 98–110)
Creat: 0.64 mg/dL (ref 0.50–0.99)
Globulin: 3 g/dL (ref 1.9–3.7)
Glucose, Bld: 89 mg/dL (ref 65–99)
Potassium: 4 mmol/L (ref 3.5–5.3)
Sodium: 137 mmol/L (ref 135–146)
Total Bilirubin: 0.6 mg/dL (ref 0.2–1.2)
Total Protein: 7.4 g/dL (ref 6.1–8.1)
eGFR: 110 mL/min/{1.73_m2} (ref >=60)

## 2024-04-01 LAB — CBC
HCT: 39.3 % (ref 35.0–45.0)
Hemoglobin: 12 g/dL (ref 11.7–15.5)
MCH: 27.9 pg (ref 27.0–33.0)
MCHC: 30.5 g/dL — ABNORMAL LOW (ref 32.0–36.0)
MCV: 91.4 fL (ref 80.0–100.0)
MPV: 10 fL (ref 7.5–12.5)
Platelets: 357 10*3/uL (ref 140–400)
RBC: 4.3 10*6/uL (ref 3.80–5.10)
RDW: 12.5 % (ref 11.0–15.0)
WBC: 6.3 10*3/uL (ref 3.8–10.8)

## 2024-04-01 LAB — TSH: TSH: 1.83 m[IU]/L

## 2024-04-01 LAB — HEMOGLOBIN A1C
Hgb A1c MFr Bld: 5.5 % (ref <5.7)
Mean Plasma Glucose: 111 mg/dL
eAG (mmol/L): 6.2 mmol/L

## 2024-04-01 LAB — LIPID PANEL
Cholesterol: 180 mg/dL (ref <200)
HDL: 79 mg/dL (ref >=50)
LDL Cholesterol (Calc): 86 mg/dL
Non-HDL Cholesterol (Calc): 101 mg/dL (ref <130)
Total CHOL/HDL Ratio: 2.3 (calc) (ref <5.0)
Triglycerides: 66 mg/dL (ref <150)

## 2024-04-01 LAB — VITAMIN D 25 HYDROXY (VIT D DEFICIENCY, FRACTURES): Vit D, 25-Hydroxy: 35 ng/mL (ref 30–100)

## 2024-04-01 LAB — CYTOLOGY - PAP
Comment: NEGATIVE
Diagnosis: NEGATIVE
High risk HPV: NEGATIVE

## 2024-04-01 LAB — SURESWAB® ADVANCED VAGINITIS PLUS,TMA
C. trachomatis RNA, TMA: NOT DETECTED
CANDIDA SPECIES: NOT DETECTED
Candida glabrata: NOT DETECTED
N. gonorrhoeae RNA, TMA: NOT DETECTED
SURESWAB(R) ADV BACTERIAL VAGINOSIS(BV),TMA: NEGATIVE
TRICHOMONAS VAGINALIS (TV),TMA: NOT DETECTED

## 2024-04-13 ENCOUNTER — Encounter: Payer: Self-pay | Admitting: Internal Medicine

## 2024-04-13 ENCOUNTER — Ambulatory Visit (INDEPENDENT_AMBULATORY_CARE_PROVIDER_SITE_OTHER): Payer: BC Managed Care – PPO | Admitting: Internal Medicine

## 2024-04-13 VITALS — BP 110/70 | HR 70 | Temp 98.4°F | Ht 65.5 in | Wt 136.6 lb

## 2024-04-13 DIAGNOSIS — Z0000 Encounter for general adult medical examination without abnormal findings: Secondary | ICD-10-CM | POA: Diagnosis not present

## 2024-04-13 DIAGNOSIS — Z8639 Personal history of other endocrine, nutritional and metabolic disease: Secondary | ICD-10-CM | POA: Diagnosis not present

## 2024-04-13 NOTE — Progress Notes (Signed)
 Established Patient Office Visit     CC/Reason for Visit: Annual preventive exam  HPI: Tina Garza is a 48 y.o. female who is coming in today for the above mentioned reasons. Past Medical History is significant for: Vitamin D  deficiency.  Feeling well without concerns or complaints.  Has routine eye and dental care.  Immunizations and cancer screenings are up-to-date.  She tells me that her GYN has recommended a hysterectomy and she is considering having this done over the summer.   Past Medical/Surgical History: Past Medical History:  Diagnosis Date   Anemia    SVD (spontaneous vaginal delivery)    x 1   Vitamin D  deficiency     Past Surgical History:  Procedure Laterality Date   AUGMENTATION MAMMAPLASTY Bilateral    2008-gel, replaced 11/2022   BREAST SURGERY     COSMETIC SURGERY  12/05/2022   Breast implants, liposuction and fat transfer   LAPAROSCOPIC OVARIAN CYSTECTOMY Left 11/07/2014   Procedure: LAPAROSCOPIC LEFT OVARIAN CYSTECTOMY AND PELVIC WASHINGS;  Surgeon: Curlee VEAR Guan, MD;  Location: [facility];  Service: Gynecology;  Laterality: Left;    Social History:  reports that she quit smoking about 32 years ago. Her smoking use included cigarettes. She started smoking about 37 years ago. She has a 1.3 pack-year smoking history. She has never used smokeless tobacco. She reports current alcohol use of about 2.0 standard drinks of alcohol per week. She reports that she does not use drugs.  Allergies: No Known Allergies  Family History:  Family History  Problem Relation Age of Onset   Hypertension Mother    Prostate cancer Father    Cancer Father    Diabetes Paternal Grandmother    Colon cancer Neg Hx    Colon polyps Neg Hx    Esophageal cancer Neg Hx    Stomach cancer Neg Hx    Rectal cancer Neg Hx      Current Outpatient Medications:    Ascorbic Acid (VITAMIN C) 100 MG tablet, , Disp: , Rfl:    Biotin 10 MG CHEW, , Disp: , Rfl:    ferrous sulfate  325 (65 FE) MG tablet, Take by mouth., Disp: , Rfl:    Folic Acid 0.8 MG CAPS, , Disp: , Rfl:    levonorgestrel  (MIRENA ) 20 MCG/DAY IUD, 1 each by Intrauterine route once., Disp: , Rfl:    MAGNESIUM PO, Take by mouth., Disp: , Rfl:    Multiple Vitamins-Minerals (HAIR SKIN AND NAILS FORMULA PO), Take by mouth., Disp: , Rfl:    Omega-3 Fatty Acids (OMEGA 3 PO), Take by mouth., Disp: , Rfl:    VITAMIN D  PO, Take by mouth., Disp: , Rfl:   Review of Systems:  Negative unless indicated in HPI.   Physical Exam: Vitals:   04/13/24 0734  BP: 110/70  Pulse: 70  Temp: 98.4 F (36.9 C)  TempSrc: Oral  SpO2: 99%  Weight: 136 lb 9.6 oz (62 kg)  Height: 5' 5.5 (1.664 m)    Body mass index is 22.39 kg/m.   Physical Exam Vitals reviewed.  Constitutional:      General: She is not in acute distress.    Appearance: Normal appearance. She is not ill-appearing, toxic-appearing or diaphoretic.  HENT:     Head: Normocephalic.     Right Ear: Tympanic membrane, ear canal and external ear normal. There is no impacted cerumen.     Left Ear: Tympanic membrane, ear canal and external ear normal. There is no  impacted cerumen.     Nose: Nose normal.     Mouth/Throat:     Mouth: Mucous membranes are moist.     Pharynx: Oropharynx is clear. No oropharyngeal exudate or posterior oropharyngeal erythema.   Eyes:     General: No scleral icterus.       Right eye: No discharge.        Left eye: No discharge.     Conjunctiva/sclera: Conjunctivae normal.     Pupils: Pupils are equal, round, and reactive to light.   Neck:     Vascular: No carotid bruit.   Cardiovascular:     Rate and Rhythm: Normal rate and regular rhythm.     Pulses: Normal pulses.     Heart sounds: Normal heart sounds.  Pulmonary:     Effort: Pulmonary effort is normal. No respiratory distress.     Breath sounds: Normal breath sounds.  Abdominal:     General: Abdomen is flat. Bowel sounds are normal.     Palpations: Abdomen is  soft.   Musculoskeletal:        General: Normal range of motion.     Cervical back: Normal range of motion.   Skin:    General: Skin is warm and dry.   Neurological:     General: No focal deficit present.     Mental Status: She is alert and oriented to person, place, and time. Mental status is at baseline.   Psychiatric:        Mood and Affect: Mood normal.        Behavior: Behavior normal.        Thought Content: Thought content normal.        Judgment: Judgment normal.      Impression and Plan:  Encounter for preventive health examination  History of vitamin D  deficiency   -Recommend routine eye and dental care. -Healthy lifestyle discussed in detail. -Labs to be updated today. -Prostate cancer screening: Not applicable Health Maintenance  Topic Date Due   HIV Screening  Never done   Hepatitis C Screening  Never done   Hepatitis B Vaccine (1 of 3 - 19+ 3-dose series) Never done   COVID-19 Vaccine (3 - Pfizer risk series) 02/18/2020   Flu Shot  05/20/2024   Pap with HPV screening  03/31/2029   DTaP/Tdap/Td vaccine (2 - Td or Tdap) 04/08/2032   Colon Cancer Screening  09/27/2033   HPV Vaccine  Aged Out   Meningitis B Vaccine  Aged Out      Kosha Jaquith Theophilus Andrews, MD [facility]

## 2024-04-28 ENCOUNTER — Ambulatory Visit (INDEPENDENT_AMBULATORY_CARE_PROVIDER_SITE_OTHER): Admitting: Obstetrics and Gynecology

## 2024-04-28 ENCOUNTER — Ambulatory Visit (INDEPENDENT_AMBULATORY_CARE_PROVIDER_SITE_OTHER)

## 2024-04-28 VITALS — BP 98/60 | HR 67 | Wt 136.0 lb

## 2024-04-28 DIAGNOSIS — N946 Dysmenorrhea, unspecified: Secondary | ICD-10-CM

## 2024-04-28 DIAGNOSIS — Z8742 Personal history of other diseases of the female genital tract: Secondary | ICD-10-CM

## 2024-04-28 DIAGNOSIS — N938 Other specified abnormal uterine and vaginal bleeding: Secondary | ICD-10-CM | POA: Diagnosis not present

## 2024-04-28 DIAGNOSIS — N921 Excessive and frequent menstruation with irregular cycle: Secondary | ICD-10-CM | POA: Diagnosis not present

## 2024-04-28 DIAGNOSIS — N8003 Adenomyosis of the uterus: Secondary | ICD-10-CM | POA: Diagnosis not present

## 2024-04-28 DIAGNOSIS — R102 Pelvic and perineal pain: Secondary | ICD-10-CM

## 2024-04-28 DIAGNOSIS — Z712 Person consulting for explanation of examination or test findings: Secondary | ICD-10-CM

## 2024-04-28 NOTE — Progress Notes (Signed)
 Patient is presenting for PUS prior to having the [facility]  G1P1L1 remarried. Vasectomy.  1st husband died at 46 yo from a Heart Attack.  Son is 47 yo in business and accounting at [employer].   RP:  Established patient presenting for annual gyn exam    HPI: Well on Mirena  IUD x 08/2018, with bothersome heavy menses for 7-10 days twice a month.  She reports she has never had light bleeding since she was 48 y/o and is bothered by her cycles.  Did not have lighter periods at any point with the mirena . Periods are also painful. She does not desire children and would like to discuss surgical options. No pelvic pain. Secretions normal.  No pain with IC.  Pap Neg 02/2020.  No h/o abnormal pap. Pap reflex today. Urine/BMs wnl. Breasts wnl.  Changed silicone implants in 11/2022 in [place]. Screening Mammo 03/2022, schedule now.  Colono this year.  BMI 22.3. Father died of Prostate Ca, no genetic screening done.  Health labs with Fam MD.  Body mass index is 21.96 kg/m.  PUS 10.29cm uterus EML 3.51mm IUD located centrally within the EM canal Multiple intramural/subserosal fibroids largest 2.2cm (1.59, 2.41, 1.90, 1.57, 1.53, 1.55cm) LO with 1.4cm complex cyst No adnexal masses     03/31/2024    9:23 AM 02/09/2023    8:08 AM 04/08/2022    8:13 AM  Depression screen PHQ 2/9  Decreased Interest 0 0 0  Down, Depressed, Hopeless 0 0 0  PHQ - 2 Score 0 0 0  Altered sleeping  0 0  Tired, decreased energy  0 0  Change in appetite  0 0  Feeling bad or failure about yourself   0 0  Trouble concentrating  0 0  Moving slowly or fidgety/restless  0 0  Suicidal thoughts  0 0  PHQ-9 Score  0 0  Difficult doing work/chores   Not difficult at all    Blood pressure 110/70, pulse 82, height 5' 5.75 (1.67 m), weight 135 lb (61.2 kg), last menstrual period 03/03/2024, SpO2 98%.     Component Value Date/Time   DIAGPAP  03/31/2023 0911    - Negative for intraepithelial lesion or malignancy (NILM)   ADEQPAP  03/31/2023  0911    Satisfactory but limited for evaluation with partially obscuring blood;   ADEQPAP transformation zone component present. 03/31/2023 0911    GYN HISTORY:    Component Value Date/Time   DIAGPAP  03/31/2023 0911    - Negative for intraepithelial lesion or malignancy (NILM)   ADEQPAP  03/31/2023 0911    Satisfactory but limited for evaluation with partially obscuring blood;   ADEQPAP transformation zone component present. 03/31/2023 0911    OB History  Gravida Para Term Preterm AB Living  1 1 1   1   SAB IAB Ectopic Multiple Live Births          # Outcome Date GA Lbr Len/2nd Weight Sex Type Anes PTL Lv  1 Term             Past Medical History:  Diagnosis Date   Anemia    SVD (spontaneous vaginal delivery)    x 1   Vitamin D  deficiency     Past Surgical History:  Procedure Laterality Date   AUGMENTATION MAMMAPLASTY Bilateral    2008-gel, replaced 11/2022   BREAST SURGERY     COSMETIC SURGERY  12/05/2022   Breast implants, liposuction and fat transfer   LAPAROSCOPIC OVARIAN CYSTECTOMY Left 11/07/2014  Procedure: LAPAROSCOPIC LEFT OVARIAN CYSTECTOMY AND PELVIC WASHINGS;  Surgeon: Curlee VEAR Guan, MD;  Location: [facility];  Service: Gynecology;  Laterality: Left;    Current Outpatient Medications on File Prior to Visit  Medication Sig Dispense Refill   Ascorbic Acid (VITAMIN C) 100 MG tablet      Biotin 10 MG CHEW      ferrous sulfate 325 (65 FE) MG tablet Take by mouth.     Folic Acid 0.8 MG CAPS      levonorgestrel  (MIRENA ) 20 MCG/DAY IUD 1 each by Intrauterine route once.     MAGNESIUM PO Take by mouth.     Multiple Vitamins-Minerals (HAIR SKIN AND NAILS FORMULA PO) Take by mouth.     Omega-3 Fatty Acids (OMEGA 3 PO) Take by mouth.     VITAMIN D  PO Take by mouth.     No current facility-administered medications on file prior to visit.    Social History   Socioeconomic History   Marital status: Married    Spouse name: Not on file   Number of children: Not  on file   Years of education: Not on file   Highest education level: Not on file  Occupational History   Not on file  Tobacco Use   Smoking status: Former    Current packs/day: 0.00    Average packs/day: 0.3 packs/day for 5.0 years (1.3 ttl pk-yrs)    Types: Cigarettes    Start date: 08/15/1986    Quit date: 08/16/1991    Years since quitting: 32.6   Smokeless tobacco: Never  Vaping Use   Vaping status: Never Used  Substance and Sexual Activity   Alcohol use: Yes    Alcohol/week: 2.0 standard drinks of alcohol    Types: 2 Glasses of wine per week    Comment: Social   Drug use: No   Sexual activity: Yes    Partners: Male    Birth control/protection: I.U.D.    Comment: Mirena   Other Topics Concern   Not on file  Social History Narrative   Not on file   Social Drivers of Health   Financial Resource Strain: Not on file  Food Insecurity: Not on file  Transportation Needs: Not on file  Physical Activity: Not on file  Stress: Not on file  Social Connections: Not on file  Intimate Partner Violence: Not on file    Family History  Problem Relation Age of Onset   Hypertension Mother    Prostate cancer Father    Cancer Father    Diabetes Paternal Grandmother    Colon cancer Neg Hx    Colon polyps Neg Hx    Esophageal cancer Neg Hx    Stomach cancer Neg Hx    Rectal cancer Neg Hx      No Known Allergies    Patient's last menstrual period was Patient's last menstrual period was 03/03/2024 (exact date)..             Review of Systems Alls systems reviewed and are negative.    From last exam Physical Exam Constitutional:      Appearance: Normal appearance.  Genitourinary:     Vulva and urethral meatus normal.     No lesions in the vagina.     Genitourinary Comments: IUD strings likely in endocervix. Tried to lower with cytobrush with no success.  To get PUS     Right Labia: No rash, lesions or skin changes.    Left Labia: No lesions, skin changes or  rash.     No vaginal discharge or tenderness.     No vaginal prolapse present.    No vaginal atrophy present.     Right Adnexa: not tender, not palpable and no mass present.    Left Adnexa: not tender, not palpable and no mass present.    No cervical motion tenderness or discharge.     Uterus is tender and irregular.     Uterus is not enlarged.  Breasts:    Right: Normal.     Left: Normal.  HENT:     Head: Normocephalic.  Neck:     Thyroid : No thyroid  mass, thyromegaly or thyroid  tenderness.   Cardiovascular:     Rate and Rhythm: Normal rate and regular rhythm.     Heart sounds: Normal heart sounds, S1 normal and S2 normal.  Pulmonary:     Effort: Pulmonary effort is normal.     Breath sounds: Normal breath sounds and air entry.  Abdominal:     General: There is no distension.     Palpations: Abdomen is soft. There is no mass.     Tenderness: There is no abdominal tenderness. There is no guarding or rebound.   Musculoskeletal:        General: Normal range of motion.     Cervical back: Full passive range of motion without pain, normal range of motion and neck supple. No tenderness.     Right lower leg: No edema.     Left lower leg: No edema.   Neurological:     Mental Status: She is alert.   Skin:    General: Skin is warm.   Psychiatric:        Mood and Affect: Mood normal.        Behavior: Behavior normal.        Thought Content: Thought content normal.  Vitals and nursing note reviewed. Exam conducted with a chaperone present.       A:         PUS results  DUB Menorrhagia History of ovarian cysts Likely adenomyosis Failed hormonal management with IUD Desires surgical management Mirena  IUD in place, Strings not seen                              P:        Pap smear collected today PUS results were reviewed with patient.  Does not need EMB since long standing endometrial protection with the mirena  IUD before the procedure Patient has scheduled surgery date.  To  return for preop appointment.  15 minutes spent on reviewing records, imaging,  and one on one patient time and counseling patient and documentation Dr. Glennon

## 2024-05-30 ENCOUNTER — Encounter: Admitting: Obstetrics and Gynecology

## 2024-06-08 ENCOUNTER — Encounter: Payer: Self-pay | Admitting: Obstetrics and Gynecology

## 2024-06-08 ENCOUNTER — Ambulatory Visit (INDEPENDENT_AMBULATORY_CARE_PROVIDER_SITE_OTHER): Admitting: Obstetrics and Gynecology

## 2024-06-08 VITALS — BP 112/52 | HR 73 | Ht 64.57 in | Wt 136.2 lb

## 2024-06-08 DIAGNOSIS — Z01818 Encounter for other preprocedural examination: Secondary | ICD-10-CM | POA: Diagnosis not present

## 2024-06-08 DIAGNOSIS — N921 Excessive and frequent menstruation with irregular cycle: Secondary | ICD-10-CM

## 2024-06-08 DIAGNOSIS — N946 Dysmenorrhea, unspecified: Secondary | ICD-10-CM

## 2024-06-08 DIAGNOSIS — N8003 Adenomyosis of the uterus: Secondary | ICD-10-CM

## 2024-06-08 MED ORDER — IBUPROFEN 800 MG PO TABS: 800.0000 mg | ORAL_TABLET | Freq: Three times a day (TID) | ORAL | 1 refills | Status: DC | PRN

## 2024-06-08 MED ORDER — METOCLOPRAMIDE HCL 10 MG PO TABS: 10.0000 mg | ORAL_TABLET | Freq: Three times a day (TID) | ORAL | 0 refills | Status: DC | PRN

## 2024-06-08 MED ORDER — OXYCODONE HCL 5 MG PO TABS: 5.0000 mg | ORAL_TABLET | ORAL | 0 refills | Status: DC | PRN

## 2024-06-08 NOTE — H&P (View-Only) (Signed)
 Patient is presenting for Preop H&P for [facility], bilateral salpingectomy, removal of IUD, cystoscopy  G1P1L1 remarried. Vasectomy.  1st husband died at 46 yo from a Heart Attack.  Son is 82 yo in business and accounting at [employer].   RP:  Established patient presenting for annual gyn exam    HPI: Well on Mirena  IUD x 08/2018, with bothersome heavy menses for 7-10 days twice a month.  She reports she has never had light bleeding since she was 48 y/o and is bothered by her cycles.  Did not have lighter periods at any point with the mirena . Periods are also painful. She does not desire children and would like to discuss surgical options. No pelvic pain. Secretions normal.  No pain with IC.  Pap Neg 02/2020.  No h/o abnormal pap. Pap reflex today. Urine/BMs wnl. Breasts wnl.  Changed silicone implants in 11/2022 in [place]. Screening Mammo 03/2022, schedule now.  Colono this year.  BMI 22.3. Father died of Prostate Ca, no genetic screening done.  Health labs with Fam MD.  Body mass index is 21.96 kg/m.  PUS 10.29cm uterus EML 3.13mm IUD located centrally within the EM canal Multiple intramural/subserosal fibroids largest 2.2cm (1.59, 2.41, 1.90, 1.57, 1.53, 1.55cm) LO with 1.4cm complex cyst No adnexal masses    Blood pressure 110/70, pulse 82, height 5' 5.75 (1.67 m), weight 135 lb (61.2 kg), last menstrual period 03/03/2024, SpO2 98%.     Component Value Date/Time   DIAGPAP  03/31/2023 0911    - Negative for intraepithelial lesion or malignancy (NILM)   ADEQPAP  03/31/2023 0911    Satisfactory but limited for evaluation with partially obscuring blood;   ADEQPAP transformation zone component present. 03/31/2023 0911    GYN HISTORY:    Component Value Date/Time   DIAGPAP  03/31/2023 0911    - Negative for intraepithelial lesion or malignancy (NILM)   ADEQPAP  03/31/2023 0911    Satisfactory but limited for evaluation with partially obscuring blood;   ADEQPAP transformation zone component  present. 03/31/2023 0911    OB History  Gravida Para Term Preterm AB Living  1 1 1   1   SAB IAB Ectopic Multiple Live Births          # Outcome Date GA Lbr Len/2nd Weight Sex Type Anes PTL Lv  1 Term             Past Medical History:  Diagnosis Date   Anemia    SVD (spontaneous vaginal delivery)    x 1   Vitamin D  deficiency     Past Surgical History:  Procedure Laterality Date   AUGMENTATION MAMMAPLASTY Bilateral    2008-gel, replaced 11/2022   BREAST SURGERY     COSMETIC SURGERY  12/05/2022   Breast implants, liposuction and fat transfer   LAPAROSCOPIC OVARIAN CYSTECTOMY Left 11/07/2014   Procedure: LAPAROSCOPIC LEFT OVARIAN CYSTECTOMY AND PELVIC WASHINGS;  Surgeon: Curlee VEAR Guan, MD;  Location: [facility];  Service: Gynecology;  Laterality: Left;    Current Outpatient Medications on File Prior to Visit  Medication Sig Dispense Refill   Ascorbic Acid (VITAMIN C) 100 MG tablet      Biotin 10 MG CHEW      ferrous sulfate 325 (65 FE) MG tablet Take by mouth.     Folic Acid 0.8 MG CAPS      levonorgestrel  (MIRENA ) 20 MCG/DAY IUD 1 each by Intrauterine route once.     MAGNESIUM PO Take by mouth.  Multiple Vitamins-Minerals (HAIR SKIN AND NAILS FORMULA PO) Take by mouth.     Omega-3 Fatty Acids (OMEGA 3 PO) Take by mouth.     VITAMIN D  PO Take by mouth.     No current facility-administered medications on file prior to visit.    Social History   Socioeconomic History   Marital status: Married    Spouse name: Not on file   Number of children: Not on file   Years of education: Not on file   Highest education level: Not on file  Occupational History   Not on file  Tobacco Use   Smoking status: Former    Current packs/day: 0.00    Average packs/day: 0.3 packs/day for 5.0 years (1.3 ttl pk-yrs)    Types: Cigarettes    Start date: 08/15/1986    Quit date: 08/16/1991    Years since quitting: 32.6   Smokeless tobacco: Never  Vaping Use   Vaping status: Never Used   Substance and Sexual Activity   Alcohol use: Yes    Alcohol/week: 2.0 standard drinks of alcohol    Types: 2 Glasses of wine per week    Comment: Social   Drug use: No   Sexual activity: Yes    Partners: Male    Birth control/protection: I.U.D.    Comment: Mirena   Other Topics Concern   Not on file  Social History Narrative   Not on file   Social Drivers of Health   Financial Resource Strain: Not on file  Food Insecurity: Not on file  Transportation Needs: Not on file  Physical Activity: Not on file  Stress: Not on file  Social Connections: Not on file  Intimate Partner Violence: Not on file    Family History  Problem Relation Age of Onset   Hypertension Mother    Prostate cancer Father    Cancer Father    Diabetes Paternal Grandmother    Colon cancer Neg Hx    Colon polyps Neg Hx    Esophageal cancer Neg Hx    Stomach cancer Neg Hx    Rectal cancer Neg Hx      No Known Allergies    Patient's last menstrual period was Patient's last menstrual period was 03/03/2024 (exact date)..             Review of Systems Alls systems reviewed and are negative.    From last exam Physical Exam Constitutional:      Appearance: Normal appearance.  Genitourinary:     Vulva and urethral meatus normal.     No lesions in the vagina.     Genitourinary Comments: IUD strings likely in endocervix. Tried to lower with cytobrush with no success.  To get PUS     Right Labia: No rash, lesions or skin changes.    Left Labia: No lesions, skin changes or rash.    No vaginal discharge or tenderness.     No vaginal prolapse present.    No vaginal atrophy present.     Right Adnexa: not tender, not palpable and no mass present.    Left Adnexa: not tender, not palpable and no mass present.    No cervical motion tenderness or discharge.     Uterus is tender and irregular.     Uterus is not enlarged.  Breasts:    Right: Normal.     Left: Normal.  HENT:     Head: Normocephalic.   Neck:     Thyroid : No thyroid  mass, thyromegaly or  thyroid  tenderness.   Cardiovascular:     Rate and Rhythm: Normal rate and regular rhythm.     Heart sounds: Normal heart sounds, S1 normal and S2 normal.  Pulmonary:     Effort: Pulmonary effort is normal.     Breath sounds: Normal breath sounds and air entry.  Abdominal:     General: There is no distension.     Palpations: Abdomen is soft. There is no mass.     Tenderness: There is no abdominal tenderness. There is no guarding or rebound.   Musculoskeletal:        General: Normal range of motion.     Cervical back: Full passive range of motion without pain, normal range of motion and neck supple. No tenderness.     Right lower leg: No edema.     Left lower leg: No edema.   Neurological:     Mental Status: She is alert.   Skin:    General: Skin is warm.   Psychiatric:        Mood and Affect: Mood normal.        Behavior: Behavior normal.        Thought Content: Thought content normal.  Vitals and nursing note reviewed. Exam conducted with a chaperone present.       A:         PUS results  DUB Menorrhagia History of ovarian cysts Likely adenomyosis Failed hormonal management with IUD Desires surgical management Mirena  IUD in place, Strings not seen                              P:     The risk, benefits, alternatives of the procedure were discussed with patient including but not limited to the risk for bleeding, infection, injury to surrounding structures such as the bowel, vessels, bladder, and ureters were reviewed.  The risk for blood products and risk for an additional procedure, and risk for laparotomy in an extreme event was discussed.  The risk for blood clots are also discussed.  Postoperative care instructions were discussed and reviewed with patient.  Postoperative medications were sent to her pharmacy and patient was instructed to pick up prior to surgery.  She will have a driver to take her there and take  her home and she agrees that that person will stay with her overnight. Post care instructions were also put in the wrap-up section of this note for patient, as a reminder of care instructions. All questions were answered and patient agrees and would like to proceed with the procedure.  DO NOT HAVE INTERCOURSE UNTIL YOU ARE INSTRUCTED THAT IT IS SAFE TO DO SO  Post operative appointments need to be scheduled at 2, 6 and 10 weeks.  You can come anytime before these with any concerns.   Discussed and reviewed with patient risks with early intercourse or use of any foreign objects (externally or internally) can increase your risk including but not limited to the risk of vaginal cuff separation and or infection, risks for bowel involvement, risk for emergent surgery, and hospital admission with need for antibiotics.  Discussed in cases with cuff separation and bowel involvement there may be the need for colostomy placement as well.  Patient understood the serious nature of early intercourse and voiced understanding.  Again discussed under no situation should she have intercourse unless cleared to do so.  This can be anywhere from 10 weeks or longer  after surgery.      15 minutes spent on reviewing records, imaging,  and one on one patient time and counseling patient and documentation Dr. Glennon

## 2024-06-08 NOTE — Patient Instructions (Signed)
 Robotic Laparoscopic Hysterectomy, Care After  The following information offers guidance on how to care for yourself after your procedure. Your health care provider may also give you more specific instructions. If you have problems or questions, contact your health care provider. What can I expect after the procedure? After the procedure, it is common to have: Pain, bruising, and numbness around your incisions. Tiredness (fatigue).  Abdominal bloating Poor appetite. Chest discomfort that radiates to your shoulder from the carbon dioxide gas for a few days after  Vaginal discharge or spotting. You will need to use a sanitary pad after this procedure.  HEAVY BLEEDING LIKE A PERIOD IS NOT NORMAL.  PLEASE CALL YOUR PROVIDER IF SOAKING A PAD or have copious discharge and or pain.  Feelings of sadness or other emotions.  If your ovaries were also removed, it is also common to have symptoms of menopause, such as hot flashes, night sweats, and lack of sleep (insomnia).  Ovaries should stay in if at all possible until at least the age of 62. Follow these instructions at home: Medicines Take over-the-counter and prescription medicines only as told by your health care provider. Ask your health care provider if the medicine prescribed to you: Requires you to avoid driving or using machinery. You cannot drive for 24 hours after anesthesia Can cause constipation. You may need to take these actions to prevent or treat constipation: Drink enough fluid to keep your urine pale yellow. Take over-the-counter or prescription medicines. Eat foods that are high in fiber, such as beans, whole grains, and fresh fruits and vegetables. Limit foods that are high in fat and processed sugars, such as fried or sweet foods.  Also, avoid spicy foods.  NAUSEA IS COMMON THE FIRST NIGHT OF SURGERY.  IF IT LASTS BEYOND 24 HOURS, CALL YOUR PROVIDER.  NAUSEA MEDICATION WAS GIVEN AT YOUR PREOP APPOINTMENT THAT YOU CAN TAKE  AFTER SURGERY. Incision care  Follow instructions from your health care provider about how to take care of your incisions. Make sure you: LEAVE INCISION OPEN AND DRY-NO BANDAGES Leave stitches (sutures), skin glue, or adhesive strips in place UNTIL 2 WEEKS THEN REMOVE IN THE SHOWER.  If adhesive strip edges start to loosen and curl up, you may trim the loose edges. Check your incision areas every day for signs of infection. Check for: More redness, swelling, or pain. Fluid or blood. Warmth. Pus or a bad smell. Activity  Rest as told by your health care provider. Avoid sitting for a long time without moving. Get up to take short walks every 1-2 hours. This is important to improve blood flow and breathing. Ask for help if you feel weak or unsteady.  If you are sore or tired, rest. Return to your normal activities as told by your health care provider. Ask your health care provider what activities are safe for you. Do not lift, push or pull anything that is heavier than 13 lb (4.5 kg), or the limit that you are told, for 6 WEEKS after surgery or until your health care provider says that it is safe. If you were given a sedative during the procedure, it can affect you for several hours. Do not drive or operate machinery until your health care provider says that it is safe. Lifestyle Do not use any products that contain nicotine or tobacco. These products include cigarettes, chewing tobacco, and vaping devices, such as e-cigarettes. These can delay healing after surgery. If you need help quitting, ask your health care provider.  Do not drink alcohol until your health care provider approves. Take a daily multivitamin and keep a high protein diet for wound healing  DO NOT HAVE INTERCOURSE UNTIL YOU ARE INSTRUCTED THAT IT IS SAFE TO DO SO  Post operative appointments need to be scheduled at 2, 6 and 10 weeks.  You can come anytime before these with any concerns.   Discussed and reviewed with patient  risks with early intercourse or use of any foreign objects (externally or internally) can increase your risk including but not limited to the risk of vaginal cuff separation and or infection, risks for bowel involvement, risk for emergent surgery, and hospital admission with need for antibiotics.  Discussed in cases with cuff separation and bowel involvement there may be the need for colostomy placement as well.  In no situation should she have intercourse unless cleared to do so.  This can be anywhere from 10 weeks or longer after surgery.  General instructions YOU MAY TAKE SHOWERS ONLY FOR 2 WEEKS AFTER SURGERY, THEN YOU MAY USE TUBS AND HOT TUBS OR SWIM AFTER THAT Do not douche, use tampons, or have sex for at least 10 weeks, or possibly longer. You will need to have an exam done in the office to be cleared to have intercourse. If you struggle with physical or emotional changes after your procedure, speak with your health care provider or a therapist.  IF YOU HAVE BURNING WITH URINATION, PLEASE CALL YOUR DOCTOR. BLADDER INFECTIONS MAY OCCUR AFTER SURGERY Try to have someone at home with you for the first week to help with your daily chores.  Most patients are driving by the end of the first week after the robotic hysterectomy. Wear compression stockings as told by your health care provider. These stockings help to prevent blood clots and reduce swelling in your legs. Keep all follow-up visits. This is important. Contact a health care provider if: You have any of these signs of infection: Chills or a fever 182f OR GREATER. More redness, swelling, or pain around an incision. Fluid or blood coming from an incision. Warmth coming from an incision. Pus or a bad smell coming from an incision. Burning with urination. Urinary frequency or cramping.   IF YOU HAVE THESE SYMPTOMS, PLEASE CALL THE OFFICE TO COME EVALUATE FOR A BLADDER INFECTION AT [phone] An incision opens. You feel dizzy or  light-headed. You have pain or bleeding when you urinate, or you are unable to urinate. You have abnormal vaginal discharge. You have pain that does not get better with medicine. Get help right away if: You have a fever and your symptoms suddenly get worse. You have severe abdominal pain. Heavy vaginal bleeding, like a period You have chest pain or shortness of breath. You may have chest pain and shortness of breath from the CO2 gas for a few days after surgery.  This is very common.  Walking, Gas-X and motrin will usually help relieve this discomfort You faint. You have pain, swelling, or redness in your leg.  These symptoms may represent a serious problem that is an emergency. Do not wait to see if the symptoms will go away. Get medical help right away. Call your local emergency services (911 in the [place] Do not drive yourself to the hospital. Summary  CONSTIPATION MEDICATION AFTER SURGERY: COLACE, MOM, MIRALAX, GAS X are all helpful to have on hand, if needed.  FILL ALL POSTOP MEDICATION BEFORE SURGERY

## 2024-06-08 NOTE — Progress Notes (Signed)
 Patient is presenting for Preop H&P for Tina Garza, bilateral salpingectomy, removal of IUD, cystoscopy  G1P1L1 remarried. Vasectomy.  1st husband died at 46 yo from a Heart Attack.  Son is 82 yo in business and accounting at [employer].   RP:  Established patient presenting for annual gyn exam    HPI: Well on Mirena  IUD x 08/2018, with bothersome heavy menses for 7-10 days twice a month.  She reports she has never had light bleeding since she was 48 y/o and is bothered by her cycles.  Did not have lighter periods at any point with the mirena . Periods are also painful. She does not desire children and would like to discuss surgical options. No pelvic pain. Secretions normal.  No pain with IC.  Pap Neg 02/2020.  No h/o abnormal pap. Pap reflex today. Urine/BMs wnl. Breasts wnl.  Changed silicone implants in 11/2022 in [place]. Screening Mammo 03/2022, schedule now.  Colono this year.  BMI 22.3. Father died of Prostate Ca, no genetic screening done.  Health labs with Fam MD.  Body mass index is 21.96 kg/m.  PUS 10.29cm uterus EML 3.13mm IUD located centrally within the EM canal Multiple intramural/subserosal fibroids largest 2.2cm (1.59, 2.41, 1.90, 1.57, 1.53, 1.55cm) LO with 1.4cm complex cyst No adnexal masses    Blood pressure 110/70, pulse 82, height 5' 5.75 (1.67 m), weight 135 lb (61.2 kg), last menstrual period 03/03/2024, SpO2 98%.     Component Value Date/Time   DIAGPAP  03/31/2023 0911    - Negative for intraepithelial lesion or malignancy (NILM)   ADEQPAP  03/31/2023 0911    Satisfactory but limited for evaluation with partially obscuring blood;   ADEQPAP transformation zone component present. 03/31/2023 0911    GYN HISTORY:    Component Value Date/Time   DIAGPAP  03/31/2023 0911    - Negative for intraepithelial lesion or malignancy (NILM)   ADEQPAP  03/31/2023 0911    Satisfactory but limited for evaluation with partially obscuring blood;   ADEQPAP transformation zone component  present. 03/31/2023 0911    OB History  Gravida Para Term Preterm AB Living  1 1 1   1   SAB IAB Ectopic Multiple Live Births          # Outcome Date GA Lbr Len/2nd Weight Sex Type Anes PTL Lv  1 Term             Past Medical History:  Diagnosis Date   Anemia    SVD (spontaneous vaginal delivery)    x 1   Vitamin D  deficiency     Past Surgical History:  Procedure Laterality Date   AUGMENTATION MAMMAPLASTY Bilateral    2008-gel, replaced 11/2022   BREAST SURGERY     COSMETIC SURGERY  12/05/2022   Breast implants, liposuction and fat transfer   LAPAROSCOPIC OVARIAN CYSTECTOMY Left 11/07/2014   Procedure: LAPAROSCOPIC LEFT OVARIAN CYSTECTOMY AND PELVIC WASHINGS;  Surgeon: Curlee VEAR Guan, MD;  Location: [facility];  Service: Gynecology;  Laterality: Left;    Current Outpatient Medications on File Prior to Visit  Medication Sig Dispense Refill   Ascorbic Acid (VITAMIN C) 100 MG tablet      Biotin 10 MG CHEW      ferrous sulfate 325 (65 FE) MG tablet Take by mouth.     Folic Acid 0.8 MG CAPS      levonorgestrel  (MIRENA ) 20 MCG/DAY IUD 1 each by Intrauterine route once.     MAGNESIUM PO Take by mouth.  Multiple Vitamins-Minerals (HAIR SKIN AND NAILS FORMULA PO) Take by mouth.     Omega-3 Fatty Acids (OMEGA 3 PO) Take by mouth.     VITAMIN D  PO Take by mouth.     No current facility-administered medications on file prior to visit.    Social History   Socioeconomic History   Marital status: Married    Spouse name: Not on file   Number of children: Not on file   Years of education: Not on file   Highest education level: Not on file  Occupational History   Not on file  Tobacco Use   Smoking status: Former    Current packs/day: 0.00    Average packs/day: 0.3 packs/day for 5.0 years (1.3 ttl pk-yrs)    Types: Cigarettes    Start date: 08/15/1986    Quit date: 08/16/1991    Years since quitting: 32.6   Smokeless tobacco: Never  Vaping Use   Vaping status: Never Used   Substance and Sexual Activity   Alcohol use: Yes    Alcohol/week: 2.0 standard drinks of alcohol    Types: 2 Glasses of wine per week    Comment: Social   Drug use: No   Sexual activity: Yes    Partners: Male    Birth control/protection: I.U.D.    Comment: Mirena   Other Topics Concern   Not on file  Social History Narrative   Not on file   Social Drivers of Health   Financial Resource Strain: Not on file  Food Insecurity: Not on file  Transportation Needs: Not on file  Physical Activity: Not on file  Stress: Not on file  Social Connections: Not on file  Intimate Partner Violence: Not on file    Family History  Problem Relation Age of Onset   Hypertension Mother    Prostate cancer Father    Cancer Father    Diabetes Paternal Grandmother    Colon cancer Neg Hx    Colon polyps Neg Hx    Esophageal cancer Neg Hx    Stomach cancer Neg Hx    Rectal cancer Neg Hx      No Known Allergies    Patient's last menstrual period was Patient's last menstrual period was 03/03/2024 (exact date)..             Review of Systems Alls systems reviewed and are negative.    From last exam Physical Exam Constitutional:      Appearance: Normal appearance.  Genitourinary:     Vulva and urethral meatus normal.     No lesions in the vagina.     Genitourinary Comments: IUD strings likely in endocervix. Tried to lower with cytobrush with no success.  To get PUS     Right Labia: No rash, lesions or skin changes.    Left Labia: No lesions, skin changes or rash.    No vaginal discharge or tenderness.     No vaginal prolapse present.    No vaginal atrophy present.     Right Adnexa: not tender, not palpable and no mass present.    Left Adnexa: not tender, not palpable and no mass present.    No cervical motion tenderness or discharge.     Uterus is tender and irregular.     Uterus is not enlarged.  Breasts:    Right: Normal.     Left: Normal.  HENT:     Head: Normocephalic.   Neck:     Thyroid : No thyroid  mass, thyromegaly or  thyroid  tenderness.   Cardiovascular:     Rate and Rhythm: Normal rate and regular rhythm.     Heart sounds: Normal heart sounds, S1 normal and S2 normal.  Pulmonary:     Effort: Pulmonary effort is normal.     Breath sounds: Normal breath sounds and air entry.  Abdominal:     General: There is no distension.     Palpations: Abdomen is soft. There is no mass.     Tenderness: There is no abdominal tenderness. There is no guarding or rebound.   Musculoskeletal:        General: Normal range of motion.     Cervical back: Full passive range of motion without pain, normal range of motion and neck supple. No tenderness.     Right lower leg: No edema.     Left lower leg: No edema.   Neurological:     Mental Status: She is alert.   Skin:    General: Skin is warm.   Psychiatric:        Mood and Affect: Mood normal.        Behavior: Behavior normal.        Thought Content: Thought content normal.  Vitals and nursing note reviewed. Exam conducted with a chaperone present.       A:         PUS results  DUB Menorrhagia History of ovarian cysts Likely adenomyosis Failed hormonal management with IUD Desires surgical management Mirena  IUD in place, Strings not seen                              P:     The risk, benefits, alternatives of the procedure were discussed with patient including but not limited to the risk for bleeding, infection, injury to surrounding structures such as the bowel, vessels, bladder, and ureters were reviewed.  The risk for blood products and risk for an additional procedure, and risk for laparotomy in an extreme event was discussed.  The risk for blood clots are also discussed.  Postoperative care instructions were discussed and reviewed with patient.  Postoperative medications were sent to her pharmacy and patient was instructed to pick up prior to surgery.  She will have a driver to take her there and take  her home and she agrees that that person will stay with her overnight. Post care instructions were also put in the wrap-up section of this note for patient, as a reminder of care instructions. All questions were answered and patient agrees and would like to proceed with the procedure.  DO NOT HAVE INTERCOURSE UNTIL YOU ARE INSTRUCTED THAT IT IS SAFE TO DO SO  Post operative appointments need to be scheduled at 2, 6 and 10 weeks.  You can come anytime before these with any concerns.   Discussed and reviewed with patient risks with early intercourse or use of any foreign objects (externally or internally) can increase your risk including but not limited to the risk of vaginal cuff separation and or infection, risks for bowel involvement, risk for emergent surgery, and hospital admission with need for antibiotics.  Discussed in cases with cuff separation and bowel involvement there may be the need for colostomy placement as well.  Patient understood the serious nature of early intercourse and voiced understanding.  Again discussed under no situation should she have intercourse unless cleared to do so.  This can be anywhere from 10 weeks or longer  after surgery.      15 minutes spent on reviewing records, imaging,  and one on one patient time and counseling patient and documentation Dr. Glennon

## 2024-06-14 ENCOUNTER — Encounter: Payer: Self-pay | Admitting: *Deleted

## 2024-06-17 ENCOUNTER — Encounter (HOSPITAL_COMMUNITY): Payer: Self-pay | Admitting: Obstetrics and Gynecology

## 2024-06-17 NOTE — Progress Notes (Signed)
 Spoke w/ via phone for pre-op interview---  pt Lab needs dos---- upt        Lab results------ lab appt 06-22-2024 @ 0900  gettting CBC/ T&S COVID test -----patient states asymptomatic no test needed Arrive at -------  0530 on 06-24-2024 NPO after MN w/ exception sips of water w/ meds Pre-Surgery Ensure or G2:  n/a  Med rec completed Medications to take morning of surgery -----  none Diabetic medication ----- n/a  GLP1 agonist last dose: n/a GLP1 instructions:  Patient instructed no nail polish to be worn day of surgery Patient instructed to bring photo id and insurance card day of surgery Patient aware to have Driver (ride ) / caregiver    for 24 hours after surgery - husband, armando hernandez Patient Special Instructions ----- will pick up soap and written instructions at lab appt Pre-Op special Instructions -----  n/a  Patient verbalized understanding of instructions that were given at this phone interview. Patient denies chest pain, sob, fever, cough at the interview.

## 2024-06-17 NOTE — Pre-Procedure Instructions (Signed)
 Surgical Instructions   Your procedure is scheduled on :  Friday,  06-24-2024. Report to [facility] Main Entrance A at 5:30  A.M., then check in with the Admitting office. Any questions or running late day of surgery: call [phone]  Questions prior to your surgery date: call [phone], Monday-Friday, 8am-4pm. If you experience any cold or flu symptoms such as cough, fever, chills, shortness of breath, etc. between now and your scheduled surgery, please notify your surgeon office.    Remember:  Do not eat any food and do not drink any liquids after midnight the night before your surgery.  This includes no water,  candy,  gum,  and  mints.    Take these medicines the morning of surgery with A SIP OF WATER :  none   May take these medicines IF NEEDED:  none    One week prior to surgery, STOP taking any Aspirin (unless otherwise instructed by your surgeon) Aleve, Naproxen, Ibuprofen , Motrin , Advil , Goody's, BC's, all herbal medications, fish oil, and non-prescription vitamins.                     Do NOT Smoke (Tobacco/Vaping) and Do Not drink alcohol for 24 hours prior to your procedure.  If you use a CPAP at night, you may bring your mask/headgear for your overnight stay.   You will be asked to remove any contacts, glasses, piercing's, hearing aid's, dentures/partials prior to surgery. Please bring cases / contact solution/ etc. for these items day of surgery.   Patients discharged the day of surgery will not be allowed to drive home, and someone needs to stay with them for 24 hours.  SURGICAL WAITING ROOM VISITATION Patients may have no more than 2 support people in the waiting area - these visitors may rotate.   Pre-op nurse will coordinate an appropriate time for 1 ADULT support person, who may not rotate, to accompany patient in pre-op.  Children under the age of 32 must have an adult with them who is not the patient and must remain in the main waiting area with an  adult.  If the patient needs to stay at the hospital during part of their recovery, the visitor guidelines for inpatient rooms apply.  Please refer to the [facility] website for the visitor guidelines for any additional information.   If you received a COVID test during your pre-op visit  it is requested that you wear a mask when out in public, stay away from anyone that may not be feeling well and notify your surgeon if you develop symptoms. If you have been in contact with anyone that has tested positive in the last 10 days please notify you surgeon.      Pre-operative CHG Bathing Instructions   You can play a key role in reducing the risk of infection after surgery. Your skin needs to be as free of germs as possible. You can reduce the number of germs on your skin by washing with CHG (chlorhexidine  gluconate) soap before surgery. CHG is an antiseptic soap that kills germs and continues to kill germs even after washing.   DO NOT use if you have an allergy to chlorhexidine /CHG or antibacterial soaps. If your skin becomes reddened or irritated, stop using the CHG and notify one of our RN day of surgery.             TAKE A SHOWER THE NIGHT BEFORE SURGERY AND THE DAY OF SURGERY    Please keep  in mind the following:  DO NOT shave, including legs and genital area, 48 hours prior to surgery.   You may shave your face before/day of surgery.  Place clean sheets on your bed the night before surgery Use a clean washcloth (not used since being washed) for each shower. DO NOT sleep with pet's night before surgery.  CHG Shower Instructions:  Wash your face and private area with normal soap. If you choose to wash your hair, wash first with your normal shampoo.  After you use shampoo/soap, rinse your hair and body thoroughly to remove shampoo/soap residue.  Turn the water OFF and apply half the bottle of CHG soap to a CLEAN washcloth.  Apply CHG soap ONLY FROM YOUR NECK DOWN TO YOUR TOES (washing  for 3-5 minutes)  DO NOT use CHG soap on face, private areas, open wounds, or sores.  Pay special attention to the area where your surgery is being performed.  If you are having back surgery, having someone wash your back for you may be helpful. Wait 2 minutes after CHG soap is applied, then you may rinse off the CHG soap.  Pat dry with a clean towel  Put on clean pajamas    Additional instructions for the day of surgery: DO NOT APPLY any lotions,  powder,  oils,  deodorants (may use underarm deodorant) , cologne/  perfumes  or makeup Do not wear jewelry /  piercing's/ meta/  permanent jewelry must be removed prior to arrival day of surgery.  (No plastic piercing) Do not wear nail polish, gel polish, artificial nails, or any other type of covering on natural finger nails (toe nails are okay) Do not bring valuables to the hospital. [facility] is not responsible for valuables/personal belongings. Put on clean/comfortable clothes.  Please brush your teeth.  Ask your nurse before applying any prescription medications to the skin.

## 2024-06-22 ENCOUNTER — Encounter (HOSPITAL_COMMUNITY)
Admission: RE | Admit: 2024-06-22 | Discharge: 2024-06-22 | Disposition: A | Discharge status: Home / Self Care | Source: Ambulatory Visit | Attending: Obstetrics and Gynecology | Admitting: Obstetrics and Gynecology

## 2024-06-22 DIAGNOSIS — Z01818 Encounter for other preprocedural examination: Secondary | ICD-10-CM | POA: Diagnosis not present

## 2024-06-22 DIAGNOSIS — Z01812 Encounter for preprocedural laboratory examination: Secondary | ICD-10-CM | POA: Insufficient documentation

## 2024-06-22 LAB — TYPE AND SCREEN
ABO/RH(D): A POS
Antibody Screen: NEGATIVE

## 2024-06-22 LAB — CBC
HCT: 37.1 % (ref 36.0–46.0)
Hemoglobin: 11.8 g/dL — ABNORMAL LOW (ref 12.0–15.0)
MCH: 28.2 pg (ref 26.0–34.0)
MCHC: 31.8 g/dL (ref 30.0–36.0)
MCV: 88.8 fL (ref 80.0–100.0)
Platelets: 378 K/uL (ref 150–400)
RBC: 4.18 MIL/uL (ref 3.87–5.11)
RDW: 13.2 % (ref 11.5–15.5)
WBC: 8 K/uL (ref 4.0–10.5)
nRBC: 0 % (ref 0.0–0.2)

## 2024-06-23 ENCOUNTER — Ambulatory Visit: Payer: Self-pay | Admitting: Obstetrics and Gynecology

## 2024-06-24 ENCOUNTER — Ambulatory Visit (HOSPITAL_COMMUNITY)
Admission: RE | Admit: 2024-06-24 | Discharge: 2024-06-24 | Disposition: A | Discharge status: Home / Self Care | Attending: Obstetrics and Gynecology | Admitting: Obstetrics and Gynecology

## 2024-06-24 ENCOUNTER — Encounter (HOSPITAL_COMMUNITY): Payer: Self-pay | Admitting: Anesthesiology

## 2024-06-24 ENCOUNTER — Ambulatory Visit (HOSPITAL_COMMUNITY): Payer: Self-pay | Admitting: Anesthesiology

## 2024-06-24 ENCOUNTER — Encounter (HOSPITAL_COMMUNITY)
Admission: RE | Disposition: A | Discharge status: Home / Self Care | Payer: Self-pay | Source: Home / Self Care | Attending: Obstetrics and Gynecology

## 2024-06-24 ENCOUNTER — Encounter (HOSPITAL_COMMUNITY): Payer: Self-pay | Admitting: Obstetrics and Gynecology

## 2024-06-24 DIAGNOSIS — N938 Other specified abnormal uterine and vaginal bleeding: Secondary | ICD-10-CM | POA: Diagnosis not present

## 2024-06-24 DIAGNOSIS — Z8742 Personal history of other diseases of the female genital tract: Secondary | ICD-10-CM | POA: Diagnosis not present

## 2024-06-24 DIAGNOSIS — D649 Anemia, unspecified: Secondary | ICD-10-CM | POA: Diagnosis not present

## 2024-06-24 DIAGNOSIS — N946 Dysmenorrhea, unspecified: Secondary | ICD-10-CM | POA: Diagnosis not present

## 2024-06-24 DIAGNOSIS — N83291 Other ovarian cyst, right side: Secondary | ICD-10-CM | POA: Diagnosis not present

## 2024-06-24 DIAGNOSIS — N921 Excessive and frequent menstruation with irregular cycle: Secondary | ICD-10-CM

## 2024-06-24 DIAGNOSIS — Z30432 Encounter for removal of intrauterine contraceptive device: Secondary | ICD-10-CM | POA: Insufficient documentation

## 2024-06-24 DIAGNOSIS — N920 Excessive and frequent menstruation with regular cycle: Secondary | ICD-10-CM | POA: Diagnosis not present

## 2024-06-24 DIAGNOSIS — N8003 Adenomyosis of the uterus: Secondary | ICD-10-CM | POA: Diagnosis not present

## 2024-06-24 DIAGNOSIS — Z01818 Encounter for other preprocedural examination: Secondary | ICD-10-CM

## 2024-06-24 DIAGNOSIS — D259 Leiomyoma of uterus, unspecified: Secondary | ICD-10-CM | POA: Diagnosis not present

## 2024-06-24 HISTORY — PX: HYSTERECTOMY, TOTAL, LAPAROSCOPIC, ROBOT-ASSISTED WITH SALPINGECTOMY: SHX7587

## 2024-06-24 HISTORY — DX: Other benign neoplasm of uterus, unspecified: D26.9

## 2024-06-24 HISTORY — DX: Excessive and frequent menstruation with irregular cycle: N92.1

## 2024-06-24 HISTORY — DX: Personal history of other diseases of the female genital tract: Z87.42

## 2024-06-24 HISTORY — DX: Dysmenorrhea, unspecified: N94.6

## 2024-06-24 HISTORY — PX: IUD REMOVAL: SHX5392

## 2024-06-24 HISTORY — PX: CYSTOSCOPY: SHX5120

## 2024-06-24 HISTORY — PX: ROBOTIC ASSISTED LAPAROSCOPIC OVARIAN CYSTECTOMY: SHX6081

## 2024-06-24 LAB — POCT PREGNANCY, URINE: Preg Test, Ur: NEGATIVE

## 2024-06-24 LAB — ABO/RH: ABO/RH(D): A POS

## 2024-06-24 SURGERY — HYSTERECTOMY, TOTAL, LAPAROSCOPIC, ROBOT-ASSISTED WITH SALPINGECTOMY
Anesthesia: General | Site: Vagina | Laterality: Right

## 2024-06-24 MED ORDER — POVIDONE-IODINE 10 % EX SWAB: 2.0000 | Freq: Once | CUTANEOUS | Status: DC

## 2024-06-24 MED ORDER — 0.9 % SODIUM CHLORIDE (POUR BTL) OPTIME
TOPICAL | Status: DC | PRN
  Administered 2024-06-24: 1000 mL

## 2024-06-24 MED ORDER — HYDROMORPHONE HCL 1 MG/ML IJ SOLN
0.2500 mg | INTRAMUSCULAR | Status: DC | PRN
  Administered 2024-06-24: 0.5 mg via INTRAVENOUS
  Administered 2024-06-24 (×2): 0.25 mg via INTRAVENOUS

## 2024-06-24 MED ORDER — CEFAZOLIN SODIUM-DEXTROSE 2-4 GM/100ML-% IV SOLN
INTRAVENOUS | Status: AC
  Filled 2024-06-24: qty 100

## 2024-06-24 MED ORDER — BUPIVACAINE HCL (PF) 0.5 % IJ SOLN
INTRAMUSCULAR | Status: DC | PRN
  Administered 2024-06-24: 10 mL

## 2024-06-24 MED ORDER — KETOROLAC TROMETHAMINE 30 MG/ML IJ SOLN
INTRAMUSCULAR | Status: DC | PRN
  Administered 2024-06-24: 30 mg via INTRAVENOUS

## 2024-06-24 MED ORDER — LIDOCAINE 2% (20 MG/ML) 5 ML SYRINGE
INTRAMUSCULAR | Status: AC
Start: 2024-06-24 — End: 2024-06-24
  Filled 2024-06-24: qty 5

## 2024-06-24 MED ORDER — OXYCODONE HCL 5 MG PO TABS: 5.0000 mg | ORAL_TABLET | Freq: Once | ORAL | Status: DC | PRN

## 2024-06-24 MED ORDER — ORAL CARE MOUTH RINSE: 15.0000 mL | Freq: Once | OROMUCOSAL | Status: AC

## 2024-06-24 MED ORDER — ACETAMINOPHEN 500 MG PO TABS
ORAL_TABLET | ORAL | Status: AC
  Filled 2024-06-24: qty 2

## 2024-06-24 MED ORDER — AMISULPRIDE (ANTIEMETIC) 5 MG/2ML IV SOLN: 10.0000 mg | Freq: Once | INTRAVENOUS | Status: DC | PRN

## 2024-06-24 MED ORDER — SCOPOLAMINE 1 MG/3DAYS TD PT72
MEDICATED_PATCH | TRANSDERMAL | Status: DC
Start: 2024-06-24 — End: 2024-06-24
  Filled 2024-06-24: qty 1

## 2024-06-24 MED ORDER — KETOROLAC TROMETHAMINE 30 MG/ML IJ SOLN: 30.0000 mg | Freq: Once | INTRAMUSCULAR | Status: DC | PRN

## 2024-06-24 MED ORDER — ROCURONIUM BROMIDE 10 MG/ML (PF) SYRINGE
PREFILLED_SYRINGE | INTRAVENOUS | Status: DC | PRN
  Administered 2024-06-24: 10 mg via INTRAVENOUS
  Administered 2024-06-24: 50 mg via INTRAVENOUS

## 2024-06-24 MED ORDER — ROCURONIUM BROMIDE 10 MG/ML (PF) SYRINGE
PREFILLED_SYRINGE | INTRAVENOUS | Status: AC
  Filled 2024-06-24: qty 10

## 2024-06-24 MED ORDER — HYDROMORPHONE HCL 1 MG/ML IJ SOLN
INTRAMUSCULAR | Status: AC
  Filled 2024-06-24: qty 1

## 2024-06-24 MED ORDER — PROPOFOL 10 MG/ML IV BOLUS
INTRAVENOUS | Status: AC
  Filled 2024-06-24: qty 20

## 2024-06-24 MED ORDER — PHENYLEPHRINE 80 MCG/ML (10ML) SYRINGE FOR IV PUSH (FOR BLOOD PRESSURE SUPPORT)
PREFILLED_SYRINGE | INTRAVENOUS | Status: DC | PRN
  Administered 2024-06-24 (×2): 80 ug via INTRAVENOUS

## 2024-06-24 MED ORDER — SODIUM CHLORIDE 0.9 % IV SOLN
Freq: Once | INTRAVENOUS | Status: AC
Start: 2024-06-24 — End: 2024-06-24
  Administered 2024-06-24: 1000 mL
  Filled 2024-06-24 (×2): qty 10

## 2024-06-24 MED ORDER — ONDANSETRON HCL 4 MG/2ML IJ SOLN: 4.0000 mg | Freq: Once | INTRAMUSCULAR | Status: DC | PRN

## 2024-06-24 MED ORDER — CEFAZOLIN SODIUM-DEXTROSE 2-4 GM/100ML-% IV SOLN
2.0000 g | INTRAVENOUS | Status: AC
  Administered 2024-06-24: 2 g via INTRAVENOUS

## 2024-06-24 MED ORDER — MIDAZOLAM HCL 2 MG/2ML IJ SOLN
INTRAMUSCULAR | Status: AC
  Filled 2024-06-24: qty 2

## 2024-06-24 MED ORDER — PROPOFOL 10 MG/ML IV BOLUS
INTRAVENOUS | Status: DC | PRN
  Administered 2024-06-24: 200 mg via INTRAVENOUS

## 2024-06-24 MED ORDER — BUPIVACAINE HCL (PF) 0.5 % IJ SOLN
INTRAMUSCULAR | Status: AC
  Filled 2024-06-24: qty 90

## 2024-06-24 MED ORDER — DEXAMETHASONE SODIUM PHOSPHATE 10 MG/ML IJ SOLN
INTRAMUSCULAR | Status: DC | PRN
  Administered 2024-06-24: 10 mg via INTRAVENOUS

## 2024-06-24 MED ORDER — SUGAMMADEX SODIUM 200 MG/2ML IV SOLN
INTRAVENOUS | Status: DC | PRN
  Administered 2024-06-24: 200 mg via INTRAVENOUS

## 2024-06-24 MED ORDER — KETAMINE HCL 50 MG/5ML IJ SOSY
PREFILLED_SYRINGE | INTRAMUSCULAR | Status: AC
  Filled 2024-06-24: qty 5

## 2024-06-24 MED ORDER — SODIUM CHLORIDE 0.9 % IV SOLN: INTRAVENOUS | Status: DC | PRN

## 2024-06-24 MED ORDER — SCOPOLAMINE 1 MG/3DAYS TD PT72
1.0000 | MEDICATED_PATCH | TRANSDERMAL | Status: DC
  Administered 2024-06-24: 1 mg via TRANSDERMAL

## 2024-06-24 MED ORDER — OXYCODONE HCL 5 MG/5ML PO SOLN: 5.0000 mg | Freq: Once | ORAL | Status: DC | PRN

## 2024-06-24 MED ORDER — ACETAMINOPHEN 500 MG PO TABS
1000.0000 mg | ORAL_TABLET | ORAL | Status: AC
  Administered 2024-06-24: 1000 mg via ORAL

## 2024-06-24 MED ORDER — SODIUM CHLORIDE 0.9 % IV SOLN
Freq: Once | INTRAVENOUS | Status: DC
  Filled 2024-06-24: qty 10

## 2024-06-24 MED ORDER — PHENYLEPHRINE 80 MCG/ML (10ML) SYRINGE FOR IV PUSH (FOR BLOOD PRESSURE SUPPORT)
PREFILLED_SYRINGE | INTRAVENOUS | Status: AC
  Filled 2024-06-24: qty 10

## 2024-06-24 MED ORDER — ONDANSETRON HCL 4 MG/2ML IJ SOLN
INTRAMUSCULAR | Status: DC | PRN
  Administered 2024-06-24: 4 mg via INTRAVENOUS

## 2024-06-24 MED ORDER — LACTATED RINGERS IV SOLN: INTRAVENOUS | Status: DC

## 2024-06-24 MED ORDER — DEXAMETHASONE SODIUM PHOSPHATE 10 MG/ML IJ SOLN
INTRAMUSCULAR | Status: AC
  Filled 2024-06-24: qty 1

## 2024-06-24 MED ORDER — CHLORHEXIDINE GLUCONATE 0.12 % MT SOLN
15.0000 mL | Freq: Once | OROMUCOSAL | Status: AC
  Administered 2024-06-24: 15 mL via OROMUCOSAL

## 2024-06-24 MED ORDER — MIDAZOLAM HCL 2 MG/2ML IJ SOLN
INTRAMUSCULAR | Status: DC | PRN
  Administered 2024-06-24: 2 mg via INTRAVENOUS

## 2024-06-24 MED ORDER — METRONIDAZOLE 500 MG/100ML IV SOLN
INTRAVENOUS | Status: AC
  Filled 2024-06-24: qty 100

## 2024-06-24 MED ORDER — METRONIDAZOLE 500 MG/100ML IV SOLN
500.0000 mg | Freq: Once | INTRAVENOUS | Status: AC
  Administered 2024-06-24: 500 mg via INTRAVENOUS
  Filled 2024-06-24: qty 100

## 2024-06-24 MED ORDER — CHLORHEXIDINE GLUCONATE 0.12 % MT SOLN
OROMUCOSAL | Status: AC
  Filled 2024-06-24: qty 15

## 2024-06-24 MED ORDER — MEPERIDINE HCL 25 MG/ML IJ SOLN: 6.2500 mg | INTRAMUSCULAR | Status: DC | PRN

## 2024-06-24 MED ORDER — FENTANYL CITRATE (PF) 250 MCG/5ML IJ SOLN
INTRAMUSCULAR | Status: DC | PRN
  Administered 2024-06-24: 100 ug via INTRAVENOUS

## 2024-06-24 MED ORDER — FENTANYL CITRATE (PF) 250 MCG/5ML IJ SOLN
INTRAMUSCULAR | Status: AC
  Filled 2024-06-24: qty 5

## 2024-06-24 MED ORDER — KETAMINE HCL 10 MG/ML IJ SOLN
INTRAMUSCULAR | Status: DC | PRN
  Administered 2024-06-24 (×2): 10 mg via INTRAVENOUS

## 2024-06-24 MED ORDER — LIDOCAINE 2% (20 MG/ML) 5 ML SYRINGE
INTRAMUSCULAR | Status: DC | PRN
  Administered 2024-06-24: 60 mg via INTRAVENOUS

## 2024-06-24 MED ORDER — ONDANSETRON HCL 4 MG/2ML IJ SOLN
INTRAMUSCULAR | Status: AC
  Filled 2024-06-24: qty 2

## 2024-06-24 SURGICAL SUPPLY — 52 items
APPLICATOR ARISTA FLEXITIP XL (MISCELLANEOUS) IMPLANT
BARRIER ADHS 3X4 INTERCEED (GAUZE/BANDAGES/DRESSINGS) IMPLANT
COVER BACK TABLE 60X90IN (DRAPES) ×4 IMPLANT
COVER TIP SHEARS 8 DVNC (MISCELLANEOUS) ×4 IMPLANT
DEFOGGER SCOPE WARM SEASHARP (MISCELLANEOUS) ×4 IMPLANT
DERMABOND ADVANCED .7 DNX12 (GAUZE/BANDAGES/DRESSINGS) ×4 IMPLANT
DRAPE ARM DVNC X/XI (DISPOSABLE) ×16 IMPLANT
DRAPE COLUMN DVNC XI (DISPOSABLE) ×4 IMPLANT
DRAPE SURG IRRIG POUCH 19X23 (DRAPES) ×4 IMPLANT
DRAPE UTILITY XL STRL (DRAPES) ×4 IMPLANT
DRIVER NDL MEGA SUTCUT DVNCXI (INSTRUMENTS) ×4 IMPLANT
DRIVER NDLE MEGA SUTCUT DVNCXI (INSTRUMENTS) ×4 IMPLANT
DURAPREP 26ML APPLICATOR (WOUND CARE) ×4 IMPLANT
ELECTRODE REM PT RTRN 9FT ADLT (ELECTROSURGICAL) ×4 IMPLANT
FORCEPS PROGRASP DVNC XI (FORCEP) ×4 IMPLANT
GAUZE 4X4 16PLY ~~LOC~~+RFID DBL (SPONGE) IMPLANT
GLOVE BIOGEL PI IND STRL 7.0 (GLOVE) ×8 IMPLANT
GLOVE NEODERM STER SZ 7 (GLOVE) ×12 IMPLANT
GOWN STRL REUS W/ TWL LRG LVL3 (GOWN DISPOSABLE) ×4 IMPLANT
HEMOSTAT ARISTA ABSORB 3G PWDR (HEMOSTASIS) IMPLANT
HOLDER FOLEY CATH W/STRAP (MISCELLANEOUS) IMPLANT
IRRIGATION SUCT STRKRFLW 2 WTP (MISCELLANEOUS) ×4 IMPLANT
KIT PINK PAD W/HEAD ARM REST (MISCELLANEOUS) ×4 IMPLANT
KIT TURNOVER KIT B (KITS) ×4 IMPLANT
LEGGING LITHOTOMY PAIR STRL (DRAPES) ×4 IMPLANT
MANIFOLD NEPTUNE II (INSTRUMENTS) ×4 IMPLANT
NS IRRIG 1000ML POUR BTL (IV SOLUTION) ×4 IMPLANT
OBTURATOR OPTICALSTD 8 DVNC (TROCAR) ×4 IMPLANT
OCCLUDER COLPOPNEUMO (BALLOONS) IMPLANT
PACK CYSTO (CUSTOM PROCEDURE TRAY) ×4 IMPLANT
PACK ROBOT WH (CUSTOM PROCEDURE TRAY) ×4 IMPLANT
PACK ROBOTIC GOWN (GOWN DISPOSABLE) ×4 IMPLANT
PAD OB MATERNITY 11 LF (PERSONAL CARE ITEMS) ×4 IMPLANT
RUMI II 3.0CM BLUE KOH-EFFICIE (DISPOSABLE) IMPLANT
RUMI II GYRUS 2.5CM BLUE (DISPOSABLE) IMPLANT
RUMI II GYRUS 3.5CM BLUE (DISPOSABLE) IMPLANT
RUMI II GYRUS 4.0CM BLUE (DISPOSABLE) IMPLANT
SCISSORS MNPLR CVD DVNC XI (INSTRUMENTS) ×4 IMPLANT
SEAL UNIV 5-12 XI (MISCELLANEOUS) ×12 IMPLANT
SEALER VESSEL EXT DVNC XI (MISCELLANEOUS) IMPLANT
SET CYSTO W/LG BORE CLAMP LF (SET/KITS/TRAYS/PACK) ×4 IMPLANT
SET TUBE SMOKE EVAC HIGH FLOW (TUBING) ×4 IMPLANT
SPIKE FLUID TRANSFER (MISCELLANEOUS) ×4 IMPLANT
SUT MNCRL AB 4-0 PS2 18 (SUTURE) ×4 IMPLANT
SUT VIC AB 0 CT1 27XBRD ANBCTR (SUTURE) IMPLANT
SUT VLOC 180 0 9IN GS21 (SUTURE) ×8 IMPLANT
TIP RUMI ORANGE 6.7MMX12CM (TIP) IMPLANT
TIP UTERINE 6.7X10CM GRN DISP (MISCELLANEOUS) IMPLANT
TIP UTERINE 6.7X8CM BLUE DISP (MISCELLANEOUS) IMPLANT
TOWEL GREEN STERILE (TOWEL DISPOSABLE) ×4 IMPLANT
TRAY FOLEY W/BAG SLVR 14FR (SET/KITS/TRAYS/PACK) ×4 IMPLANT
UNDERPAD 30X36 HEAVY ABSORB (UNDERPADS AND DIAPERS) ×4 IMPLANT

## 2024-06-24 NOTE — Anesthesia Postprocedure Evaluation (Signed)
 Anesthesia Post Note  Patient: Tina Garza  Procedure(s) Performed: HYSTERECTOMY, TOTAL, LAPAROSCOPIC, ROBOT-ASSISTED WITH SALPINGECTOMY (Bilateral: Pelvis) CYSTOSCOPY (Bladder) REMOVAL, INTRAUTERINE DEVICE (Vagina ) CYSTOTOMY, CYST, OVARY, ROBOT-ASSISTED, LAPAROSCOPIC (Right: Pelvis)     Patient location during evaluation: PACU Anesthesia Type: General Level of consciousness: awake and alert, oriented and patient cooperative Pain management: pain level controlled Vital Signs Assessment: post-procedure vital signs reviewed and stable Respiratory status: spontaneous breathing, nonlabored ventilation and respiratory function stable Cardiovascular status: blood pressure returned to baseline and stable Postop Assessment: no apparent nausea or vomiting Anesthetic complications: no   No notable events documented.  Last Vitals:  Vitals:   06/24/24 0945 06/24/24 1000  BP: 106/88 124/60  Pulse: 86 82  Resp: 15 (!) 29  Temp:    SpO2: 99% 99%    Last Pain:  Vitals:   06/24/24 1000  TempSrc:   PainSc: 3                  Tina Garza

## 2024-06-24 NOTE — Discharge Instructions (Addendum)
     No acetaminophen /Tylenol  until after 12:15 pm today if needed.     Post Anesthesia Home Care Instructions  Activity: Get plenty of rest for the remainder of the day. A responsible individual must stay with you for 24 hours following the procedure.  For the next 24 hours, DO NOT: -Drive a car -Advertising copywriter -Drink alcoholic beverages -Take any medication unless instructed by your physician -Make any legal decisions or sign important papers.  Meals: Start with liquid foods such as gelatin or soup. Progress to regular foods as tolerated. Avoid greasy, spicy, heavy foods. If nausea and/or vomiting occur, drink only clear liquids until the nausea and/or vomiting subsides. Call your physician if vomiting continues.  Special Instructions/Symptoms: Your throat may feel dry or sore from the anesthesia or the breathing tube placed in your throat during surgery. If this causes discomfort, gargle with warm salt water. The discomfort should disappear within 24 hours.  If you had a scopolamine patch placed behind your ear for the management of post- operative nausea and/or vomiting:  1. The medication in the patch is effective for 72 hours, after which it should be removed.  Wrap patch in a tissue and discard in the trash. Wash hands thoroughly with soap and water. 2. You may remove the patch earlier than 72 hours if you experience unpleasant side effects which may include dry mouth, dizziness or visual disturbances. 3. Avoid touching the patch. Wash your hands with soap and water after contact with the patch.

## 2024-06-24 NOTE — Op Note (Addendum)
 06/24/2024  [id number] Tina Garza        OPERATIVE REPORT   Preop Diagnosis: menorrhagia, dysmenorrhea, anemia, fibroids, removal of IUD Procedure: robotic hysterectomy, bilateral salpingectomy, right ovarian cystotomy, cystoscopy   Surgeon: Dr. Almarie Rollo Carpen Assistant:  Circulator: Raguel Sherra CROME, RN Physician Assistant: Nicholaus Jorene BRAVO, PA-C Relief Circulator: [place], Hadassah Buel RAMAN, RN Scrub Person: [place], Jackolyn VIRGIN Neysa Joesph JAYSON Circulator Assistant: Jackquline Judyann CROME, RN; Waddell Silvano HERO, RN    Fluids: please see anesthesia report   Complications: None Anesthesia: General     Findings:  boggy contour 8cm uterus, normal ovaries and tubes Cystoscopy at the end of the case with normal bladder and patent ureters bilaterally.   Estimated blood loss: Minimal 30cc   Specimens: Uterus, cervix and bilateral tubes, mirena  IUD   Disposition of specimen: Pathology           Patient is taken to the operating room. She is placed in the supine position. She is a running IV in place. Informed consent was present on the chart. SCDs on her lower extremities and functioning properly. Patient was positioned while she was awake.  Her legs were placed in the low lithotomy position in Moodus stirrups. Her arms were tucked by the side.  General endotracheal anesthesia was administered by the anesthesia staff without difficulty.       Dura prep was then used to prep the abdomen and Hibiclens  was used to prep the inner thighs, perineum and vagina. Once 3 minutes had past the patient was draped in a normal standard fashion. A proper time out was performed and everyone agreed.  The legs were lifted to the high lithotomy position. A bivalve speculum was inserted into the vagina and the anterior lip of the cervix was grasped with single-tooth tenaculum.  The uterus sounded to 12 cm. Pratt dilators were used to dilate the cervix.  The RUMI uterine manipulator was obtained  inserted into the endometrial cavity and the bulb of the disposable tip was inflated with 8 cc of normal saline. There was a good fit of the KOH ring around the cervix. The tenaculum and bivavle speculum was removed. There is also good manipulation of the uterus.  A Foley catheter was placed to straight drain.  Clear urine was noted. Legs were lowered to the low lithotomy position and attention was turned the abdomen.   Superior to the umbilicus, marcaine  0.25% used to anesthetize the skin.  Using #11 blade, 8mm skin incision was made.  The 8mm robotic trocar and sleeve was inserted under direct visualization.  CO2 gas was  started and patient was placed in trendelenburg position.  Two additional 8mm ports were placed under direct visualization in the left and right lower quadrant.     Ureters were identifies.  Attention was turned to the left side. The left tube was elevated and the mesosalpinx was desiccated with the vessel sealer.  The left uterine ovarian pedicle was serially clamped cauterized and incised. Left round ligament was serially clamped cauterized and incised. The anterior and posterior peritoneum of the inferior leaf of the broad ligament were opened. The beginning of the bladder flap was created.  The bladder was taken down below the level of the KOH ring. The left uterine artery skeletonized and then just superior to the KOH ring this vessel was serially clamped, cauterized, and incised.   Attention was turned the right side. 2cm simple right ovarian cyst was seen and cystotomy was performed.  Clear fluid  was drained.  Good hemostasis was seen. The uterus was placed on stretch to the opposite side.    The mesosalpinx was incised freeing the tube. Then the right uterine ovarian pedicle was serially clamped cauterized and incised. Next the right round ligament was serially clamped cauterized and incised. The anterior posterior peritoneum of the inferiorly for the broad ligament were opened. The  anterior peritoneum was carried across to the dissection on the left side. The remainder of the bladder flap was created using sharp dissection. The bladder was well below the level of the KOH ring. The right uterine artery skeletonized. Then the right uterine artery, above the level of the KOH ring, was serially clamped cauterized and incised. The uterus was devascularized at this point.   The colpotomy was performed.  This was carried around a circumferential fashion until the vaginal mucosa was completely incised in the specimen was freed.  The specimen was then delivered to the vagina intact.  A vaginal occlusive device was used to maintain the pneumoperitoneum   Instruments were changed with a needle driver and prograsp.  Using a 9 inch  zero V-lock suture, the cuff was closed by incorporating the anterior and posterior vaginal mucosa in each stitch. This was carried across all the way to the left corner and a running fashion. Two stitches were brought back towards the midline and the suture was cut flush with the vagina. The needle was brought out the pelvis. The pelvis was irrigated. All pedicles were inspected. No bleeding was noted.   CO2 pressures were lowered to 8mm Hg.  Again, no bleeding was noted.  Ureters were noted deep in the pelvis to be peristalsing.  At this point the procedure was completed.  The remaining instruments were removed.  The ports were removed under direct visualization of the laparoscope and the pneumoperitoneum was relieved.   The skin was then closed with subcuticular stitches of 3-0 Vicryl. The skin was cleansed Dermabond was applied. Attention was then turned the vagina and the cuff was inspected. No bleeding was noted.  The Foley catheter was removed.  Cystoscopy was performed.  No sutures or bladder injuries were noted.  Ureters were noted with normal urine jets from each one was seen.   Vaginal exam with 1mm vaginal cuff reinforcement with 2 figure eight suture with  zero vicryl was placed on right. Good hemostasis was noted.  Foley was left out after the cystoscopic fluid was drained and cystoscope removed.  Sponge, lap, needle, instrument counts were correct x2. Patient tolerated the procedure very well. She was awakened from anesthesia, extubated and taken to recovery in stable condition.      Dr. Glennon

## 2024-06-24 NOTE — Anesthesia Procedure Notes (Signed)
 Procedure Name: Intubation Date/Time: 06/24/2024 7:45 AM  Performed by: Larri Yehle C, CRNAPre-anesthesia Checklist: Patient identified, Emergency Drugs available, Suction available and Patient being monitored Patient Re-evaluated:Patient Re-evaluated prior to induction Oxygen Delivery Method: Circle System Utilized Preoxygenation: Pre-oxygenation with 100% oxygen Induction Type: IV induction Ventilation: Mask ventilation without difficulty Laryngoscope Size: Mac and 3 Grade View: Grade I Tube type: Oral Tube size: 7.0 mm Number of attempts: 1 Airway Equipment and Method: Stylet and Oral airway Placement Confirmation: ETT inserted through vocal cords under direct vision, positive ETCO2 and breath sounds checked- equal and bilateral Secured at: 22 cm Tube secured with: Tape Dental Injury: Teeth and Oropharynx as per pre-operative assessment

## 2024-06-24 NOTE — Transfer of Care (Signed)
 Immediate Anesthesia Transfer of Care Note  Patient: [place]  Procedure(s) Performed: HYSTERECTOMY, TOTAL, LAPAROSCOPIC, ROBOT-ASSISTED WITH SALPINGECTOMY (Bilateral: Pelvis) CYSTOSCOPY (Bladder) REMOVAL, INTRAUTERINE DEVICE (Vagina ) CYSTOTOMY, CYST, OVARY, ROBOT-ASSISTED, LAPAROSCOPIC (Right: Pelvis)  Patient Location: PACU  Anesthesia Type:General  Level of Consciousness: awake, alert , and sedated  Airway & Oxygen Therapy: Patient Spontanous Breathing and Patient connected to face mask oxygen  Post-op Assessment: Report given to RN and Post -op Vital signs reviewed and stable  Post vital signs: Reviewed and stable  Last Vitals:  Vitals Value Taken Time  BP 137/91 06/24/24 09:10  Temp    Pulse 91 06/24/24 09:14  Resp 17 06/24/24 09:14  SpO2 100 % 06/24/24 09:14  Vitals shown include unfiled device data.  Last Pain:  Vitals:   06/24/24 0606  TempSrc: Oral  PainSc: 0-No pain      Patients Stated Pain Goal: 7 (06/24/24 0606)  Complications: No notable events documented.

## 2024-06-24 NOTE — Anesthesia Preprocedure Evaluation (Addendum)
 Anesthesia Evaluation  Patient identified by MRN, date of birth, ID band Patient awake    Reviewed: Allergy & Precautions, H&P , NPO status , Patient's Chart, lab work & pertinent test results  Airway Mallampati: II  TM Distance: >3 FB Neck ROM: Full    Dental  (+) Teeth Intact, Dental Advisory Given   Pulmonary former smoker   Pulmonary exam normal breath sounds clear to auscultation       Cardiovascular negative cardio ROS Normal cardiovascular exam Rhythm:Regular Rate:Normal     Neuro/Psych negative neurological ROS  negative psych ROS   GI/Hepatic negative GI ROS, Neg liver ROS,,,  Endo/Other  negative endocrine ROS    Renal/GU negative Renal ROS  negative genitourinary   Musculoskeletal negative musculoskeletal ROS (+)    Abdominal   Peds negative pediatric ROS (+)  Hematology  (+) Blood dyscrasia, anemia Hb 11.8, plt 378   Anesthesia Other Findings   Reproductive/Obstetrics negative OB ROS Urine preg neg                              Anesthesia Physical Anesthesia Plan  ASA: 1  Anesthesia Plan: General   Post-op Pain Management: Tylenol  PO (pre-op)*, Toradol  IV (intra-op)*, Ketamine  IV*, Dilaudid  IV and Precedex   Induction: Intravenous  PONV Risk Score and Plan: 4 or greater and Ondansetron , Dexamethasone , Midazolam , Scopolamine  patch - Pre-op and Treatment may vary due to age or medical condition  Airway Management Planned: Oral ETT  Additional Equipment: None  Intra-op Plan:   Post-operative Plan: Extubation in OR  Informed Consent: I have reviewed the patients History and Physical, chart, labs and discussed the procedure including the risks, benefits and alternatives for the proposed anesthesia with the patient or authorized representative who has indicated his/her understanding and acceptance.     Dental advisory given  Plan Discussed with: CRNA  Anesthesia  Plan Comments:          Anesthesia Quick Evaluation

## 2024-06-24 NOTE — Interval H&P Note (Signed)
 No changes to H&P Patient seen and examined Reviewed H&P and patient agreed All questions answered Blood pressure 136/80, pulse (!) 103, temperature 98.4 F (36.9 C), temperature source Oral, resp. rate 17, height 5' 6 (1.676 m), weight 60.3 kg, last menstrual period 06/03/2024, SpO2 100%.  Dr. Glennon

## 2024-06-27 ENCOUNTER — Encounter (HOSPITAL_COMMUNITY): Payer: Self-pay | Admitting: Obstetrics and Gynecology

## 2024-06-27 LAB — SURGICAL PATHOLOGY, GROSS ONLY (NOT ARMC)

## 2024-06-28 ENCOUNTER — Ambulatory Visit (HOSPITAL_BASED_OUTPATIENT_CLINIC_OR_DEPARTMENT_OTHER): Payer: Self-pay | Admitting: Obstetrics and Gynecology

## 2024-07-01 ENCOUNTER — Ambulatory Visit

## 2024-07-07 ENCOUNTER — Ambulatory Visit (INDEPENDENT_AMBULATORY_CARE_PROVIDER_SITE_OTHER): Admitting: Obstetrics and Gynecology

## 2024-07-07 ENCOUNTER — Encounter: Payer: Self-pay | Admitting: Obstetrics and Gynecology

## 2024-07-07 VITALS — BP 108/62 | HR 95 | Wt 134.0 lb

## 2024-07-07 DIAGNOSIS — Z09 Encounter for follow-up examination after completed treatment for conditions other than malignant neoplasm: Secondary | ICD-10-CM

## 2024-07-07 NOTE — Progress Notes (Signed)
 Patient presents for 2 week postop from [facility], bilateral salpingectomy, cystoscopy. She is doing well. No fevers, VB, dysuria or severe abdominal pain.  BP 108/62   Pulse 95   Wt 134 lb (60.8 kg)   LMP 06/03/2024 (Exact Date)   SpO2 98%   BMI 21.63 kg/m   Abdomen: incisions I/c/d, NT, ND  A/p PO from [facility] 2 weeks doing well Encouraged no heavy lifting, pushing, pulling greater than 13 lbs for full 6 weeks 2. Pelvic rest until cleared for intercourse.  Counseled on risks with early intercourse 3. RTC with any concerns or with heavy bleeding, fevers or severe abdominal pain. And with 6 and 10 wk postop  Dr. Glennon

## 2024-07-08 ENCOUNTER — Encounter: Payer: Self-pay | Admitting: Obstetrics and Gynecology

## 2024-07-08 MED ORDER — ESTRADIOL 0.1 MG/GM VA CREA: 1.0000 | TOPICAL_CREAM | Freq: Every day | VAGINAL | 1 refills | Status: AC

## 2024-08-04 ENCOUNTER — Ambulatory Visit (INDEPENDENT_AMBULATORY_CARE_PROVIDER_SITE_OTHER): Admitting: Obstetrics and Gynecology

## 2024-08-04 VITALS — BP 110/60 | HR 91 | Wt 135.6 lb

## 2024-08-04 DIAGNOSIS — Z23 Encounter for immunization: Secondary | ICD-10-CM

## 2024-08-04 DIAGNOSIS — Z09 Encounter for follow-up examination after completed treatment for conditions other than malignant neoplasm: Secondary | ICD-10-CM

## 2024-08-04 NOTE — Progress Notes (Signed)
 Patient presents for 6 week postop from [facility], bilateral salpingectomy, cystoscopy. She is doing well. No fevers, VB, dysuria or severe abdominal pain.  BP 110/60   Pulse 91   Wt 135 lb 9.6 oz (61.5 kg)   LMP 06/03/2024 (Exact Date)   SpO2 96%   BMI 21.89 kg/m   SVE: sutures seen and dissolving, normal discharge, no bleeding  A/p PO from [facility] 6 weeks doing well Resume more activities 2. Pelvic rest until cleared 3. RTC at 10 wk visit or sooner with any concerns  Dr. Glennon

## 2024-08-18 ENCOUNTER — Ambulatory Visit
Admission: RE | Admit: 2024-08-18 | Discharge: 2024-08-18 | Disposition: A | Discharge status: Home / Self Care | Source: Ambulatory Visit | Attending: Obstetrics and Gynecology | Admitting: Obstetrics and Gynecology

## 2024-08-18 DIAGNOSIS — Z01419 Encounter for gynecological examination (general) (routine) without abnormal findings: Secondary | ICD-10-CM

## 2024-08-18 DIAGNOSIS — Z1231 Encounter for screening mammogram for malignant neoplasm of breast: Secondary | ICD-10-CM | POA: Diagnosis not present

## 2024-08-23 ENCOUNTER — Other Ambulatory Visit: Payer: Self-pay | Admitting: Obstetrics and Gynecology

## 2024-08-23 DIAGNOSIS — R928 Other abnormal and inconclusive findings on diagnostic imaging of breast: Secondary | ICD-10-CM

## 2024-09-01 ENCOUNTER — Ambulatory Visit (INDEPENDENT_AMBULATORY_CARE_PROVIDER_SITE_OTHER): Admitting: Obstetrics and Gynecology

## 2024-09-01 ENCOUNTER — Encounter: Payer: Self-pay | Admitting: Obstetrics and Gynecology

## 2024-09-01 ENCOUNTER — Ambulatory Visit
Admission: RE | Admit: 2024-09-01 | Discharge: 2024-09-01 | Disposition: A | Source: Ambulatory Visit | Attending: Obstetrics and Gynecology

## 2024-09-01 ENCOUNTER — Ambulatory Visit: Admission: RE | Admit: 2024-09-01 | Source: Ambulatory Visit

## 2024-09-01 VITALS — BP 110/68 | HR 74 | Wt 137.4 lb

## 2024-09-01 DIAGNOSIS — R928 Other abnormal and inconclusive findings on diagnostic imaging of breast: Secondary | ICD-10-CM | POA: Diagnosis not present

## 2024-09-01 DIAGNOSIS — Z09 Encounter for follow-up examination after completed treatment for conditions other than malignant neoplasm: Secondary | ICD-10-CM

## 2024-09-01 NOTE — Progress Notes (Signed)
 Patient presents for 10 week postop from [facility], bilateral salpingectomy, cystoscopy. She is doing well. No fevers, VB, dysuria or severe abdominal pain.  BP 110/68   Pulse 74   Wt 137 lb 6.4 oz (62.3 kg)   LMP 06/03/2024 (Exact Date)   SpO2 98%   BMI 22.18 kg/m   SVE: one sutures seen and dissolving, normal discharge, no bleeding, good vaginal support. No tenderness to qtip palpation  A/p PO from [facility] 10 weeks doing well Resume activities 2. Pelvic rest for additional 1-2 wks 3. Encouraged annual mammograms and resume annual care  4. RTC with any concerns  Dr. Glennon

## 2024-09-02 ENCOUNTER — Ambulatory Visit: Payer: Self-pay | Admitting: Obstetrics and Gynecology
